# Patient Record
Sex: Male | Born: 1965
Health system: Southern US, Community
[De-identification: ages and names within clinical notes are randomized; demographics above are authoritative.]

## PROBLEM LIST (undated history)

## (undated) DIAGNOSIS — Q909 Down syndrome, unspecified: Secondary | ICD-10-CM

## (undated) DIAGNOSIS — H10409 Unspecified chronic conjunctivitis, unspecified eye: Secondary | ICD-10-CM

## (undated) DIAGNOSIS — Z8701 Personal history of pneumonia (recurrent): Secondary | ICD-10-CM

## (undated) DIAGNOSIS — B351 Tinea unguium: Secondary | ICD-10-CM

## (undated) HISTORY — PX: ENTROPIAN REPAIR: SHX1512

## (undated) HISTORY — DX: Down syndrome, unspecified: Q90.9

## (undated) HISTORY — DX: Personal history of pneumonia (recurrent): Z87.01

## (undated) HISTORY — DX: Tinea unguium: B35.1

## (undated) HISTORY — DX: Unspecified chronic conjunctivitis, unspecified eye: H10.409

---

## 2006-10-15 ENCOUNTER — Emergency Department: Payer: Self-pay | Admitting: Emergency Medicine

## 2011-01-05 ENCOUNTER — Inpatient Hospital Stay: Payer: Self-pay | Admitting: Internal Medicine

## 2012-05-15 ENCOUNTER — Ambulatory Visit: Payer: Medicare Other | Admitting: Internal Medicine

## 2012-08-29 ENCOUNTER — Ambulatory Visit (INDEPENDENT_AMBULATORY_CARE_PROVIDER_SITE_OTHER): Payer: Medicare Other | Admitting: Adult Health

## 2012-08-29 ENCOUNTER — Encounter: Payer: Self-pay | Admitting: Adult Health

## 2012-08-29 ENCOUNTER — Telehealth: Payer: Self-pay | Admitting: Internal Medicine

## 2012-08-29 ENCOUNTER — Ambulatory Visit (INDEPENDENT_AMBULATORY_CARE_PROVIDER_SITE_OTHER)
Admission: RE | Admit: 2012-08-29 | Discharge: 2012-08-29 | Disposition: A | Discharge status: Home / Self Care | Payer: Medicare Other | Source: Ambulatory Visit | Attending: Adult Health | Admitting: Adult Health

## 2012-08-29 VITALS — BP 106/68 | HR 73 | Temp 98.6°F | Resp 16 | Ht 62.0 in | Wt 204.0 lb

## 2012-08-29 DIAGNOSIS — R05 Cough: Secondary | ICD-10-CM

## 2012-08-29 DIAGNOSIS — H6123 Impacted cerumen, bilateral: Secondary | ICD-10-CM | POA: Insufficient documentation

## 2012-08-29 DIAGNOSIS — H6120 Impacted cerumen, unspecified ear: Secondary | ICD-10-CM

## 2012-08-29 MED ORDER — AZITHROMYCIN 250 MG PO TABS: ORAL_TABLET | ORAL | Status: DC

## 2012-08-29 MED ORDER — HYDROCODONE-HOMATROPINE 5-1.5 MG/5ML PO SYRP: 5.0000 mL | ORAL_SOLUTION | Freq: Three times a day (TID) | ORAL | Status: DC | PRN

## 2012-08-29 MED ORDER — FLUTICASONE PROPIONATE 50 MCG/ACT NA SUSP: NASAL | Status: DC

## 2012-08-29 MED ORDER — ALBUTEROL SULFATE (2.5 MG/3ML) 0.083% IN NEBU: 2.5000 mg | INHALATION_SOLUTION | Freq: Four times a day (QID) | RESPIRATORY_TRACT | Status: DC | PRN

## 2012-08-29 NOTE — Telephone Encounter (Signed)
Gwenyth Allegra is his sister and guardian. She is wanting to know the results of his x-ray

## 2012-08-29 NOTE — Patient Instructions (Addendum)
  Please have Chad Stewart start his antibiotic today. Azithromycin take 2 tablets on the first day and then take 1 tablet daily for the next 4 days.  Start flonase nasal spray. Two sprays in each nostril daily.  Hycodan for cough. This medication can cause drowsiness.  His lungs sound very congested. Please use the combivent inhaler regularly while he is having symptoms.  Please go to the [facility] office for chest xray.

## 2012-08-29 NOTE — Assessment & Plan Note (Addendum)
Lungs sound congested. Consolidation that does not clear with patient coughing. Suspect pneumonia. Start Azithromycin. Nebulizer with albuterol given in clinic. Flonase nasal spray. Combivent q6 hours as needed. Chest xray at [facility]. Note that greater than 45 minutes was spent with patient/caregiver in face to face communication during this visit.

## 2012-08-29 NOTE — Telephone Encounter (Signed)
Patient's guardian would like to know results of x-ray

## 2012-08-29 NOTE — Assessment & Plan Note (Signed)
Irrigate bilateral ears for disimpaction of cerumen.

## 2012-08-29 NOTE — Progress Notes (Signed)
  Subjective:    Patient ID: Chad Stewart, male    DOB: [birth year], 47 y.o.   MRN: [id number]  HPI  Patient is a 47 y/o male who presents to clinic for c/o cough, congestion, rhinorrhea. Uncertain if patient has had fever with these episodes. Patient is here with a caregiver from the [facility]. Patient has Down Syndrome and unable to provide information. The caregiver does not know much about the patient health history or allergies. The caregiver has brought in patient's entire chart for my review. Patient was previously followed at [facility] by Dr. Candelaria Stagers. Chad Stewart is here to establish care with our clinic.    Review of Systems  HENT: Positive for congestion, rhinorrhea and sinus pressure.   Respiratory: Positive for cough.   Cardiovascular: Negative.   Genitourinary: Negative.   Psychiatric/Behavioral: Negative for behavioral problems. The patient is not nervous/anxious.        Patient does not provide adequate ROS. Caregiver reports symptoms noted. Remainder of ROS negative.    BP 106/68  Pulse 73  Temp 98.6 F (37 C) (Oral)  Resp 16  Ht 5\' 2"  (1.575 m)  Wt 204 lb (92.534 kg)  BMI 37.31 kg/m2  SpO2 96%      Objective:   Physical Exam  Constitutional: He appears well-developed and well-nourished. No distress.       Patient is sitting on exam table. He has a calm demeanor. Cooperative.  HENT:  Head: Normocephalic and atraumatic.  Mouth/Throat: No oropharyngeal exudate.       Pharynx reddened. Nasal mucosa injected. Bilateral ears with cerumen impaction.  Eyes:       Left eye s/p entropion repair. Patient unable to fully open lids on the left eye.  Neck: Normal range of motion. Neck supple. No tracheal deviation present.  Cardiovascular: Normal rate, regular rhythm and normal heart sounds.  Exam reveals no gallop.   No murmur heard. Pulmonary/Chest: Effort normal. He has wheezes.       Rhonchi throughout upper lobes bilaterally, anterior/posterior. Does  not clear with cough. Patient c/o bilateral rib pain 2/2 coughing.  Abdominal: Soft. He exhibits no mass. There is no tenderness.       Bowel sounds hypoactive.  Musculoskeletal: He exhibits no edema and no tenderness.  Lymphadenopathy:    He has cervical adenopathy.  Neurological: He is alert.       Oriented to person.   Skin: Skin is warm and dry. No rash noted.  Psychiatric: He has a normal mood and affect. His behavior is normal.          Assessment & Plan:

## 2012-08-30 ENCOUNTER — Telehealth: Payer: Self-pay | Admitting: *Deleted

## 2012-08-30 NOTE — Telephone Encounter (Signed)
Manager of Group Home requesting copy of [id number] faxed

## 2012-10-05 ENCOUNTER — Ambulatory Visit (INDEPENDENT_AMBULATORY_CARE_PROVIDER_SITE_OTHER): Payer: Medicare Other | Admitting: Adult Health

## 2012-10-05 ENCOUNTER — Encounter: Payer: Self-pay | Admitting: Adult Health

## 2012-10-05 ENCOUNTER — Ambulatory Visit: Payer: Medicare Other | Admitting: Adult Health

## 2012-10-05 VITALS — BP 112/69 | HR 62 | Temp 99.8°F

## 2012-10-05 DIAGNOSIS — M109 Gout, unspecified: Secondary | ICD-10-CM

## 2012-10-05 MED ORDER — IBUPROFEN 600 MG PO TABS: ORAL_TABLET | ORAL | Status: DC

## 2012-10-05 MED ORDER — PREDNISONE (PAK) 10 MG PO TABS: ORAL_TABLET | ORAL | Status: DC

## 2012-10-05 NOTE — Progress Notes (Signed)
  Subjective:    Patient ID: SELIG WAMPOLE, male    DOB: 1966-02-27, 47 y.o.   MRN: [id number]  HPI  Elsworth presents to clinic with his care giver from the [facility]. He has pain in his left great toe and swelling. Symptoms began 2 days ago. He is having a hard time bearing weight on that foot.   Current Outpatient Prescriptions on File Prior to Visit  Medication Sig Dispense Refill  . albuterol-ipratropium (COMBIVENT) 18-103 MCG/ACT inhaler Inhale 2 puffs into the lungs every 6 (six) hours as needed. For wheezing      . Artificial Tear Ointment (REFRESH P.M. OP) Apply to eye 4 (four) times daily. Apply to left eye 4 times daily and to right eye at bedtime.      Marland Kitchen azithromycin (ZITHROMAX) 250 MG tablet Take 2 tablets on day 1 then take 1 tablet daily for the next 4 days.  6 tablet  0  . econazole nitrate 1 % cream Apply 1 application topically 2 (two) times daily. Apply a small amount to affected area twice daily      . fluticasone (FLONASE) 50 MCG/ACT nasal spray 2 sprays in each nostril daily  16 g  6  . HYDROcodone-homatropine (HYCODAN) 5-1.5 MG/5ML syrup Take 5 mLs by mouth every 8 (eight) hours as needed for cough.  120 mL  0  . prednisoLONE acetate (PRED FORTE) 1 % ophthalmic suspension Place 1 drop into the left eye daily.      Marland Kitchen tolnaftate (TINACTIN) 1 % spray Apply topically daily.       No current facility-administered medications on file prior to visit.     Review of Systems  Musculoskeletal: Positive for joint swelling.       Pain in left great toe  Skin:       Skin erythematous at left great toe    BP 112/69  Pulse 62  Temp(Src) 99.8 F (37.7 C) (Oral)  SpO2 96%     Objective:   Physical Exam  Constitutional: He appears well-developed and well-nourished.  Down Syndrome  Musculoskeletal: He exhibits edema and tenderness.  Tenderness upon palpation of movement of left great toe.  Skin: Skin is warm and dry. No rash noted. There is erythema.  Psychiatric:  He has a normal mood and affect. His behavior is normal. Judgment and thought content normal.           Assessment & Plan:

## 2012-10-05 NOTE — Patient Instructions (Addendum)
  I am treating Chad Stewart for gout.  Start ibuprofen 600 mg every 6 hours for 10 days. He needs to take this with food.  Start a prednisone taper. Start with 60 mg on the first day and decrease by 10 mg daily until done.  He will need to stay out of work on Friday. He can return to work on Monday.  If he is not improved within 4-5 days, please call the office.  Gout is triggered often by foods high in purines. He should limit the amount of red meats, seafood.

## 2012-10-05 NOTE — Assessment & Plan Note (Signed)
Start NSAIDS x 10 days. Prednisone taper. RTC if symptoms do not improve within 5 days.

## 2012-10-06 ENCOUNTER — Telehealth: Payer: Self-pay | Admitting: General Practice

## 2012-10-06 NOTE — Telephone Encounter (Signed)
Chad Stewart - I think this is your pt.

## 2012-10-06 NOTE — Telephone Encounter (Signed)
Pt sister called in regards to pt's gout. Would like to know what causes gout or are there any preventive measures, any special shoes? States the Group home does not keep her informed and she wants to make sure he is taken care of correctly.

## 2012-10-08 NOTE — Telephone Encounter (Signed)
Chad Stewart,  Can you send the following information to [name] sister?  Thanks, Chad Stewart  Gout is a type of arthritis. It occurs when uric acid builds up in blood and causes inflammation in the joints. Acute gout is a painful condition that often affects only one joint; Chronic gout is repeated episodes of pain and inflammation. More than one joint may be affected.  What causes gout? Gout occurs when excess uric acid (a normal waste product) collects in the body, and needle?like urate crystals deposit in the joints. This may happen because either uric acid production increases or, more often, the kidneys cannot remove uric acid from the body well enough. Certain foods and drugs may raise uric acid levels and lead to gout attacks. These include the following: Foods such as shellfish and red meats  Alcohol in excess  Sugary drinks and foods that are high in fructose  Some medications  low-dose aspirin (but because it can help protect against heart attacks and strokes, we do not recommend that people with gout stop taking low-dose aspirin)  certain diuretics ("water pills") such as hydrochlorothiazide (Esidrix, Hydro?D)  immunosuppressants used in organ transplants such as cyclosporine (Neoral, Sandimmune) and tacrolimus (Prograf) Over time, increased uric acid levels in the blood may lead to deposits of urate crystals in and around the joints. These crystals can attract white blood cells, leading to severe, painful gout attacks and chronic arthritis. Uric acid also can deposit in the urinary tract, causing kidney stones.   Patient Instructions Foods that have a high purine content (i.e., alcohol, seafood, and offal) are associated with higher risk of elevated uric acid and gout. [2]  Reducing intake of alcohol, especially beer, lowers the risk of gout. [29]  Reducing seafood and meat intake helps to a lesser degree. Reducing the intake of vegetables high in purines (i.e., asparagus, spinach,  and mushrooms) does not seem to affect uric acid levels.  Dairy products reduce the risk of gout.

## 2012-10-09 ENCOUNTER — Telehealth: Payer: Self-pay | Admitting: Internal Medicine

## 2012-10-09 ENCOUNTER — Encounter: Payer: Self-pay | Admitting: *Deleted

## 2012-10-09 NOTE — Telephone Encounter (Signed)
Spoke with patient sister, she will come pick this information. Information up front for her to pick up

## 2012-10-09 NOTE — Telephone Encounter (Signed)
KAY MORRIS PT SISTER WANTED TO KNOW IF Chad Stewart NEEDS TO SEE UROLOGIEST ABOUT HIS GOUT.   DOES PT NEED TO HAVE SPECIAL DIET TO HELP CONTROL THIS  PLEASE ADVISE

## 2012-10-10 NOTE — Telephone Encounter (Signed)
Nothing further. At this time he does not need to see a urologist.  Thanks, Dianah Pruett

## 2012-10-10 NOTE — Telephone Encounter (Signed)
Patient sister was given a handout of information on gout, anything further you would like for me tell his sister?

## 2012-10-11 NOTE — Telephone Encounter (Signed)
Informed patient sister of this information and she verbally agreed.

## 2012-10-13 ENCOUNTER — Telehealth: Payer: Self-pay | Admitting: Internal Medicine

## 2012-10-13 NOTE — Telephone Encounter (Signed)
LEFT MESSAGE FOR JESSICA TO CALL OFFICE CHECKING TO SEE IF RAQUEL REY CAN BE MS Deandrade FOR PRIMARY CARE OR DO THAT WANT DR Dan Humphreys IF THEY WANT DR WALKER MR Lovick NEEDS APPOINTMENT WITH DR Dan Humphreys TO FILL OUT FORMS

## 2012-10-19 ENCOUNTER — Telehealth: Payer: Self-pay | Admitting: *Deleted

## 2012-10-19 NOTE — Telephone Encounter (Signed)
Faxed documentation back to [facility] per Raquel's instructions

## 2012-12-26 ENCOUNTER — Telehealth: Payer: Self-pay | Admitting: Adult Health

## 2012-12-26 NOTE — Telephone Encounter (Signed)
If there is a opening, then we can use it. Otherwise, we will need to use next available spot for physical exam

## 2012-12-26 NOTE — Telephone Encounter (Signed)
Fwd to Dr. Walker 

## 2012-12-26 NOTE — Telephone Encounter (Signed)
Spoke with Zella Ball, appointment was confirmed for June 17 at 9:00

## 2012-12-26 NOTE — Telephone Encounter (Signed)
Chad Stewart @ [facility] She needs to get Mr Summer in for a cpx  Between  Dates 01/05/13 @ 02/05/13 for medicare to pay.  Pt was schedule with Raquel 01/15/13 but Zella Ball stated medicare would not pay unless paperwork was a md sign them.  She wanted to know if he could be worked in earlier with you

## 2013-01-15 ENCOUNTER — Encounter: Payer: Medicare Other | Admitting: Adult Health

## 2013-01-25 ENCOUNTER — Telehealth: Payer: Self-pay | Admitting: Adult Health

## 2013-01-26 ENCOUNTER — Encounter: Payer: Self-pay | Admitting: Adult Health

## 2013-01-26 ENCOUNTER — Ambulatory Visit (INDEPENDENT_AMBULATORY_CARE_PROVIDER_SITE_OTHER): Payer: Medicare Other | Admitting: Adult Health

## 2013-01-26 VITALS — BP 106/70 | HR 64 | Temp 98.3°F | Resp 12 | Ht 61.5 in | Wt 207.0 lb

## 2013-01-26 DIAGNOSIS — Z0000 Encounter for general adult medical examination without abnormal findings: Secondary | ICD-10-CM

## 2013-01-26 MED ORDER — IPRATROPIUM-ALBUTEROL 18-103 MCG/ACT IN AERO: 2.0000 | INHALATION_SPRAY | Freq: Four times a day (QID) | RESPIRATORY_TRACT | Status: AC | PRN

## 2013-01-26 MED ORDER — FLUTICASONE PROPIONATE 50 MCG/ACT NA SUSP: NASAL | Status: DC

## 2013-01-26 MED ORDER — PREDNISOLONE ACETATE 1 % OP SUSP: 1.0000 [drp] | Freq: Every day | OPHTHALMIC | Status: AC

## 2013-01-26 MED ORDER — TOLNAFTATE 1 % EX AERP: INHALATION_SPRAY | Freq: Every day | CUTANEOUS | Status: DC

## 2013-01-26 MED ORDER — ACETAMINOPHEN 325 MG PO TABS: 650.0000 mg | ORAL_TABLET | ORAL | Status: AC | PRN

## 2013-01-26 MED ORDER — ECONAZOLE NITRATE 1 % EX CREA: 1.0000 "application " | TOPICAL_CREAM | Freq: Two times a day (BID) | CUTANEOUS | Status: DC

## 2013-01-26 NOTE — Progress Notes (Signed)
  Subjective:    Patient ID: Chad Stewart, male    DOB: 03/20/1966, 47 y.o.   MRN: [id number]  HPI  Patient is a 47 year old male with history of Down syndrome who presents to clinic for physical exam and completion of an FL2 form. Patient is in his usual joking behavior. He presents to clinic with one of his caregivers. Patient lives at [facility].   Past Medical History  Diagnosis Date  . Down syndrome   . Chronic conjunctivitis   . History of pneumonia   . Onychomycosis     Past Surgical History  Procedure Laterality Date  . Entropian repair      History   Social History  . Marital Status: Single    Spouse Name: N/A    Number of Children: N/A  . Years of Education: N/A   Occupational History  . Not on file.   Social History Main Topics  . Smoking status: Never Smoker   . Smokeless tobacco: Never Used  . Alcohol Use: No  . Drug Use: No  . Sexually Active: Not on file   Other Topics Concern  . Not on file   Social History Narrative  . No narrative on file     Review of Systems  Unable to perform ROS Secondary to cognitive deficit. Pt reports "feeling fine"     Objective:   Physical Exam  Constitutional:  Overweight, pleasant male in no apparent distress  HENT:  Head: Normocephalic and atraumatic.  Nose: Nose normal.  Mouth/Throat: Oropharynx is clear and moist.  Bilateral ears are impacted with cerumen  Eyes: EOM are normal.  Left eye s/p entropion repair with scar tissue; chronic conjunctivitis  Neck: Normal range of motion. Neck supple. No tracheal deviation present. No thyromegaly present.  Cardiovascular: Normal rate, regular rhythm, normal heart sounds and intact distal pulses.  Exam reveals no gallop and no friction rub.   No murmur heard. Pulmonary/Chest: Effort normal and breath sounds normal. No respiratory distress. He has no wheezes. He has no rales.  Abdominal: Soft. Bowel sounds are normal.  Musculoskeletal: Normal range of  motion. He exhibits no edema and no tenderness.  Lymphadenopathy:    He has no cervical adenopathy.  Neurological: He is alert. Coordination normal.  Skin: Skin is warm and dry.  Onychomycosis of all toes  Psychiatric:  Patient is very feely, touchy. Inappropriately hugging provider and holding on while trying to perform exam.          Assessment & Plan:

## 2013-01-26 NOTE — Patient Instructions (Addendum)
  Please return on Monday morning for fasting labs. You may have water or black coffee.   We will notify you once the results are available.  Your FL2 will be ready on Monday.

## 2013-01-29 ENCOUNTER — Other Ambulatory Visit (INDEPENDENT_AMBULATORY_CARE_PROVIDER_SITE_OTHER): Payer: Medicare Other

## 2013-01-29 ENCOUNTER — Other Ambulatory Visit: Payer: Self-pay | Admitting: *Deleted

## 2013-01-29 ENCOUNTER — Telehealth: Payer: Self-pay | Admitting: *Deleted

## 2013-01-29 DIAGNOSIS — Z0000 Encounter for general adult medical examination without abnormal findings: Secondary | ICD-10-CM

## 2013-01-29 LAB — COMPREHENSIVE METABOLIC PANEL
AST: 18 U/L (ref 0–37)
Albumin: 3.5 g/dL (ref 3.5–5.2)
Alkaline Phosphatase: 51 U/L (ref 39–117)
BUN: 14 mg/dL (ref 6–23)
Calcium: 8.5 mg/dL (ref 8.4–10.5)
Chloride: 106 mEq/L (ref 96–112)
Potassium: 4.2 mEq/L (ref 3.5–5.1)
Sodium: 140 mEq/L (ref 135–145)
Total Protein: 7 g/dL (ref 6.0–8.3)

## 2013-01-29 LAB — CBC WITH DIFFERENTIAL/PLATELET
Basophils Relative: 0.7 % (ref 0.0–3.0)
Eosinophils Absolute: 0 10*3/uL (ref 0.0–0.7)
Eosinophils Relative: 0.5 % (ref 0.0–5.0)
Lymphocytes Relative: 20 % (ref 12.0–46.0)
MCHC: 33.7 g/dL (ref 30.0–36.0)
MCV: 97.6 fl (ref 78.0–100.0)
Monocytes Absolute: 0.4 10*3/uL (ref 0.1–1.0)
Neutrophils Relative %: 72 % (ref 43.0–77.0)
Platelets: 181 10*3/uL (ref 150.0–400.0)
RBC: 5.26 Mil/uL (ref 4.22–5.81)
WBC: 6.3 10*3/uL (ref 4.5–10.5)

## 2013-01-29 LAB — LIPID PANEL
LDL Cholesterol: 107 mg/dL — ABNORMAL HIGH (ref 0–99)
Total CHOL/HDL Ratio: 4

## 2013-01-29 MED ORDER — GUAIFENESIN ER 600 MG PO TB12: 1200.0000 mg | ORAL_TABLET | Freq: Two times a day (BID) | ORAL | Status: AC | PRN

## 2013-01-29 NOTE — Telephone Encounter (Signed)
Needs Rx for Mucinex [facility]n [facility]

## 2013-01-30 ENCOUNTER — Encounter: Payer: Self-pay | Admitting: *Deleted

## 2013-01-30 ENCOUNTER — Encounter: Payer: Medicare Other | Admitting: Internal Medicine

## 2013-04-24 ENCOUNTER — Telehealth: Payer: Self-pay | Admitting: Adult Health

## 2013-04-24 ENCOUNTER — Ambulatory Visit (INDEPENDENT_AMBULATORY_CARE_PROVIDER_SITE_OTHER): Payer: Medicare Other | Admitting: Adult Health

## 2013-04-24 ENCOUNTER — Other Ambulatory Visit: Payer: Self-pay | Admitting: Adult Health

## 2013-04-24 ENCOUNTER — Ambulatory Visit (INDEPENDENT_AMBULATORY_CARE_PROVIDER_SITE_OTHER)
Admission: RE | Admit: 2013-04-24 | Discharge: 2013-04-24 | Disposition: A | Discharge status: Home / Self Care | Payer: Medicare Other | Source: Ambulatory Visit | Attending: Adult Health | Admitting: Adult Health

## 2013-04-24 ENCOUNTER — Encounter: Payer: Self-pay | Admitting: Adult Health

## 2013-04-24 VITALS — BP 106/62 | HR 54 | Temp 98.4°F | Resp 12 | Ht 61.5 in | Wt 213.0 lb

## 2013-04-24 DIAGNOSIS — M25571 Pain in right ankle and joints of right foot: Secondary | ICD-10-CM

## 2013-04-24 DIAGNOSIS — M25579 Pain in unspecified ankle and joints of unspecified foot: Secondary | ICD-10-CM

## 2013-04-24 MED ORDER — PREDNISONE (PAK) 10 MG PO TABS: ORAL_TABLET | ORAL | Status: DC

## 2013-04-24 MED ORDER — POLYMYXIN B-TRIMETHOPRIM 10000-0.1 UNIT/ML-% OP SOLN: 1.0000 [drp] | OPHTHALMIC | Status: DC

## 2013-04-24 NOTE — Telephone Encounter (Signed)
See other phone message  

## 2013-04-24 NOTE — Telephone Encounter (Signed)
Sent in polytrim eye drops

## 2013-04-24 NOTE — Telephone Encounter (Signed)
Pt's sister notified that eye drops sent to pharmacy.

## 2013-04-24 NOTE — Telephone Encounter (Signed)
The patient's sister is wanting to know if the NP was going to call the patient in some eye drops. She thinks he has pink eye.

## 2013-04-24 NOTE — Patient Instructions (Addendum)
  Xray right foot at [facility] prednisone taper - 60 mg on day #1 then taper by 10 mg daily until complete

## 2013-04-24 NOTE — Telephone Encounter (Signed)
Thinks they forgot pt eye drops at appt this morning.  Please advise if this needs to be picked up.

## 2013-04-24 NOTE — Assessment & Plan Note (Signed)
Send for xray to r/o any bony abnormality. Will start prednisone taper since hx of gout. RTC if symptoms do not improve within 3-4 days.

## 2013-04-24 NOTE — Progress Notes (Signed)
  Subjective:    Patient ID: Chad Stewart, male    DOB: [birth year], 47 y.o.   MRN: [id number]  HPI  Patient presents with c/o of right foot pain. He denies having any injury to the area. He has a hx of gout. Pt is getting ready to go on a trip to [place]. He presents to clinic with his sister. Sister reports that he was limping on Saturday but has not noticed any limping today.  Current Outpatient Prescriptions on File Prior to Visit  Medication Sig Dispense Refill  . acetaminophen (TYLENOL) 325 MG tablet Take 2 tablets (650 mg total) by mouth every 4 (four) hours as needed for pain or fever.  90 tablet  6  . albuterol-ipratropium (COMBIVENT) 18-103 MCG/ACT inhaler Inhale 2 puffs into the lungs every 6 (six) hours as needed. For wheezing  14.7 g  6  . Artificial Tear Ointment (REFRESH P.M. OP) Apply to eye 4 (four) times daily. Apply to left eye 4 times daily and to right eye at bedtime.      Marland Kitchen econazole nitrate 1 % cream Apply 1 application topically 2 (two) times daily. Apply a small amount to affected area twice daily  15 g  6  . fluticasone (FLONASE) 50 MCG/ACT nasal spray 2 sprays in each nostril daily  16 g  6  . guaiFENesin (MUCINEX) 600 MG 12 hr tablet Take 2 tablets (1,200 mg total) by mouth 2 (two) times daily as needed for congestion.  90 tablet  2  . HYDROcodone-homatropine (HYCODAN) 5-1.5 MG/5ML syrup Take 5 mLs by mouth every 8 (eight) hours as needed for cough.  120 mL  0  . prednisoLONE acetate (PRED FORTE) 1 % ophthalmic suspension Place 1 drop into the left eye daily.  5 mL  6  . tolnaftate (TINACTIN) 1 % spray Apply topically daily.  130 g  6   No current facility-administered medications on file prior to visit.    Review of Systems  Musculoskeletal:       Pain right foot on the outer aspect     BP 106/62  Pulse 54  Temp(Src) 98.4 F (36.9 C) (Oral)  Resp 12  Ht 5' 1.5" (1.562 m)  Wt 213 lb (96.616 kg)  BMI 39.6 kg/m2  SpO2 95%     Objective:   Physical  Exam  Constitutional: He is oriented to person, place, and time.  47 y/o Down Syndrome patient in NAD  Musculoskeletal:  Right foot pain. There is some edema and erythema  Neurological: He is alert and oriented to person, place, and time.  Psychiatric: He has a normal mood and affect. His behavior is normal.          Assessment & Plan:

## 2013-04-30 ENCOUNTER — Telehealth: Payer: Self-pay | Admitting: Adult Health

## 2013-04-30 NOTE — Telephone Encounter (Signed)
[facility] wanting to know when he should stop the eye drops he was prescribed. Please call [phone]

## 2013-05-01 ENCOUNTER — Telehealth: Payer: Self-pay | Admitting: Adult Health

## 2013-05-01 NOTE — Telephone Encounter (Signed)
States they need discontinuation order for the eye drops stating to discontinue today.  Fax to [phone]

## 2013-05-01 NOTE — Telephone Encounter (Signed)
Group home called back regarding length of time for eyedrops. Advised 7 days.

## 2013-05-01 NOTE — Telephone Encounter (Signed)
7 days

## 2013-05-01 NOTE — Telephone Encounter (Signed)
Order faxed.

## 2013-05-02 ENCOUNTER — Other Ambulatory Visit: Payer: Self-pay | Admitting: *Deleted

## 2013-05-02 MED ORDER — HYDROCODONE-HOMATROPINE 5-1.5 MG/5ML PO SYRP: 5.0000 mL | ORAL_SOLUTION | Freq: Three times a day (TID) | ORAL | Status: DC | PRN

## 2013-05-02 NOTE — Telephone Encounter (Signed)
OK to refill

## 2013-05-02 NOTE — Telephone Encounter (Signed)
Rx faxed

## 2013-05-02 NOTE — Telephone Encounter (Signed)
He has a cough and would like a refill of the Hycodan cough syrup. The last prescription has expired and they usually give this to him PRN and they are trying to catch it before it develops into something worse. Last prescription was sent in by Raquel.

## 2013-05-09 ENCOUNTER — Ambulatory Visit (INDEPENDENT_AMBULATORY_CARE_PROVIDER_SITE_OTHER): Payer: Medicare Other | Admitting: Adult Health

## 2013-05-09 ENCOUNTER — Encounter: Payer: Self-pay | Admitting: *Deleted

## 2013-05-09 ENCOUNTER — Encounter: Payer: Self-pay | Admitting: Adult Health

## 2013-05-09 VITALS — BP 110/62 | HR 60 | Temp 98.4°F | Resp 12 | Wt 200.5 lb

## 2013-05-09 DIAGNOSIS — R05 Cough: Secondary | ICD-10-CM

## 2013-05-09 DIAGNOSIS — L0292 Furuncle, unspecified: Secondary | ICD-10-CM

## 2013-05-09 DIAGNOSIS — H109 Unspecified conjunctivitis: Secondary | ICD-10-CM

## 2013-05-09 MED ORDER — HYDROCODONE-HOMATROPINE 5-1.5 MG/5ML PO SYRP: 5.0000 mL | ORAL_SOLUTION | Freq: Three times a day (TID) | ORAL | Status: DC | PRN

## 2013-05-09 MED ORDER — DOXYCYCLINE HYCLATE 100 MG PO TABS: ORAL_TABLET | ORAL | Status: DC

## 2013-05-09 MED ORDER — POLYMYXIN B-TRIMETHOPRIM 10000-0.1 UNIT/ML-% OP SOLN: 1.0000 [drp] | OPHTHALMIC | Status: DC

## 2013-05-09 NOTE — Assessment & Plan Note (Addendum)
Start doxycycline 100 mg bid x 14 days. Neosporin to area twice daily x 7 days. RTC if no improvement or if symptoms worsen. Note, greater than 30 min spent in face to face communication with patient/caregiver in review of current symptoms and in the assessment planning and implementation of care pertaining to the 3 problems listed today. Also, since patient lives in a group home, form was filled out with the instructions on his care. They do not accept the AVS as the instructions.

## 2013-05-09 NOTE — Progress Notes (Signed)
  Subjective:    Patient ID: Chad Stewart, male    DOB: Sep 28, 1965, 47 y.o.   MRN: [id number]  HPI  Patient is a pleasant 47 year old male with Down syndrome who presents to clinic with his caregiver. Caregiver reports that patient has been coughing. No sick contacts although he lives in a group home. Denies fever or chills. Also having redness in the right eye concerning for conjunctivitis. He was recently seen a few weeks ago with conjunctivitis in the left eye status post treatment. Also concerns of possible boil above penis. Foul odor reported by patient from this site.   Current Outpatient Prescriptions on File Prior to Visit  Medication Sig Dispense Refill  . acetaminophen (TYLENOL) 325 MG tablet Take 2 tablets (650 mg total) by mouth every 4 (four) hours as needed for pain or fever.  90 tablet  6  . albuterol-ipratropium (COMBIVENT) 18-103 MCG/ACT inhaler Inhale 2 puffs into the lungs every 6 (six) hours as needed. For wheezing  14.7 g  6  . Artificial Tear Ointment (REFRESH P.M. OP) Apply to eye 4 (four) times daily. Apply to left eye 4 times daily and to right eye at bedtime.      Marland Kitchen econazole nitrate 1 % cream Apply 1 application topically 2 (two) times daily. Apply a small amount to affected area twice daily  15 g  6  . fluticasone (FLONASE) 50 MCG/ACT nasal spray 2 sprays in each nostril daily  16 g  6  . guaiFENesin (MUCINEX) 600 MG 12 hr tablet Take 2 tablets (1,200 mg total) by mouth 2 (two) times daily as needed for congestion.  90 tablet  2  . prednisoLONE acetate (PRED FORTE) 1 % ophthalmic suspension Place 1 drop into the left eye daily.  5 mL  6  . tolnaftate (TINACTIN) 1 % spray Apply topically daily.  130 g  6   No current facility-administered medications on file prior to visit.    Review of Systems  HENT: Positive for postnasal drip. Negative for congestion and sinus pressure.   Respiratory: Positive for cough. Negative for shortness of breath and wheezing.    Genitourinary:       Skin bump in area above penis with foul odor       Objective:   Physical Exam  Constitutional:  No apparent distress  HENT:  Head: Normocephalic and atraumatic.  Eyes:  Right eye conjunctivitis  Cardiovascular: Normal rate and regular rhythm.   Pulmonary/Chest: Effort normal.  Unable to assess breath sounds - patient would not take deep breath.  Genitourinary:  Skin boil in area above penis. Appears to have scab. Uncertain if this is from patient manipulation of area. No drainage.  Lymphadenopathy:    He has no cervical adenopathy.  Neurological: He is alert.  Psychiatric: He has a normal mood and affect.    BP 110/62  Pulse 60  Temp(Src) 98.4 F (36.9 C) (Oral)  Resp 12  Wt 200 lb 8 oz (90.946 kg)  BMI 37.28 kg/m2  SpO2 96%     Assessment & Plan:

## 2013-05-09 NOTE — Assessment & Plan Note (Signed)
Seen several weeks ago for conjunctivitis of left eye. Now right eye. Polytrim every 4 hours to OU. RTC if no improvement.

## 2013-05-09 NOTE — Assessment & Plan Note (Addendum)
Start doxycycline. Continue hycodan syrup. RTC if no improvement within 3-4 days.

## 2013-05-10 ENCOUNTER — Telehealth: Payer: Self-pay | Admitting: *Deleted

## 2013-05-10 NOTE — Telephone Encounter (Signed)
Spoke with sister about appointment and treatment. Verbalized understanding.

## 2013-05-10 NOTE — Telephone Encounter (Signed)
Patient sister Joyce Gross left a voicemail yesterday stating she would like someone to call her about his appointment yesterday. He was seen by Raquel yesterday. She is his guardian and would like to know what was done at his appointment, he is going out of town and want to make sure he is ok.

## 2013-05-12 ENCOUNTER — Emergency Department: Payer: Self-pay | Admitting: Emergency Medicine

## 2013-05-12 LAB — COMPREHENSIVE METABOLIC PANEL
Albumin: 3 g/dL — ABNORMAL LOW (ref 3.4–5.0)
Alkaline Phosphatase: 73 U/L (ref 50–136)
Anion Gap: 1 — ABNORMAL LOW (ref 7–16)
BUN: 12 mg/dL (ref 7–18)
Bilirubin,Total: 0.4 mg/dL (ref 0.2–1.0)
Calcium, Total: 8.9 mg/dL (ref 8.5–10.1)
Chloride: 104 mmol/L (ref 98–107)
Co2: 34 mmol/L — ABNORMAL HIGH (ref 21–32)
Creatinine: 1.28 mg/dL (ref 0.60–1.30)
EGFR (African American): >60
EGFR (Non-African Amer.): >60
Glucose: 98 mg/dL (ref 65–99)
Osmolality: 277 (ref 275–301)
Potassium: 4.3 mmol/L (ref 3.5–5.1)
SGPT (ALT): 25 U/L (ref 12–78)
Total Protein: 7.1 g/dL (ref 6.4–8.2)

## 2013-05-12 LAB — CBC
HCT: 46.5 % (ref 40.0–52.0)
HGB: 16 g/dL (ref 13.0–18.0)
MCH: 33.1 pg (ref 26.0–34.0)
RDW: 14.1 % (ref 11.5–14.5)
WBC: 8 10*3/uL (ref 3.8–10.6)

## 2013-05-12 LAB — TROPONIN I: Troponin-I: <0.02 ng/mL

## 2013-05-28 ENCOUNTER — Telehealth: Payer: Self-pay | Admitting: Adult Health

## 2013-05-28 NOTE — Telephone Encounter (Signed)
Needs referral for pt to see Dr. Gertie Baron for consultation asap.  Dr. Chestine Spore has agreed to see pt but requires the referral.

## 2013-05-29 NOTE — Telephone Encounter (Signed)
Patients sister called back and wanted someone to clarify that he had never seen Dr. Dan Humphreys. I explained to her that there was an apt made last year but it was cancelled, when calling back I'm not sure of the conversation that took place but it could have been Dr. Dan Humphreys wasn't accepting new patients, but I couldn't be sure. The patient had an apt made with Raquel as a new patient, and there were no notes made as to who called and made it for him.

## 2013-05-29 NOTE — Telephone Encounter (Signed)
Pt's sister would like pt to have referral to Dr. Chestine Spore for repair of his eye lid. States he had a surgery years ago that has left a hole in his eye. Not an urgent appointment, as he has dealt with this for years. He uses drops 2-3 times daily to keep is moist. States she is ok until Raquel returns to the office on Monday. She was also upset that she thought Dr. Dan Humphreys was his PCP, but I only see where he has seen Raquel. States when she came by office last year, she was told Dr. Dan Humphreys would be his PCP because she sees sister and another relative. Can this be changed?

## 2013-05-29 NOTE — Telephone Encounter (Signed)
We generally don't change providers within the office, but I can discuss with Raquel when she returns. He will need to be seen for referral.

## 2013-06-04 NOTE — Telephone Encounter (Signed)
Spoke with Joyce Gross (sister). She reports that original appt was set with Dr. Dan Humphreys. Does not know who changed the appt. She is ok with pt staying with me.

## 2013-06-14 ENCOUNTER — Ambulatory Visit (INDEPENDENT_AMBULATORY_CARE_PROVIDER_SITE_OTHER): Payer: Medicare Other

## 2013-06-14 DIAGNOSIS — Z23 Encounter for immunization: Secondary | ICD-10-CM

## 2013-07-09 ENCOUNTER — Ambulatory Visit (INDEPENDENT_AMBULATORY_CARE_PROVIDER_SITE_OTHER): Payer: Medicare Other | Admitting: Adult Health

## 2013-07-09 VITALS — BP 124/74 | HR 68 | Temp 98.4°F | Wt 218.0 lb

## 2013-07-09 DIAGNOSIS — M109 Gout, unspecified: Secondary | ICD-10-CM

## 2013-07-09 MED ORDER — OMEPRAZOLE 20 MG PO CPDR: 20.0000 mg | DELAYED_RELEASE_CAPSULE | Freq: Every day | ORAL | Status: DC

## 2013-07-09 MED ORDER — NAPROXEN 500 MG PO TABS: 500.0000 mg | ORAL_TABLET | Freq: Two times a day (BID) | ORAL | Status: DC

## 2013-07-09 NOTE — Progress Notes (Signed)
  Subjective:    Patient ID: Chad Stewart, male    DOB: 18-Aug-1965, 47 y.o.   MRN: [id number]  HPI  Patient is a pleasant Down syndrome 47 year old male who presents to clinic with right great toe pain. Pt has hx of gout. No fever, chills. Denies injury.  Current Outpatient Prescriptions on File Prior to Visit  Medication Sig Dispense Refill  . acetaminophen (TYLENOL) 325 MG tablet Take 2 tablets (650 mg total) by mouth every 4 (four) hours as needed for pain or fever.  90 tablet  6  . albuterol-ipratropium (COMBIVENT) 18-103 MCG/ACT inhaler Inhale 2 puffs into the lungs every 6 (six) hours as needed. For wheezing  14.7 g  6  . Artificial Tear Ointment (REFRESH P.M. OP) Apply to eye 4 (four) times daily. Apply to left eye 4 times daily and to right eye at bedtime.      Marland Kitchen doxycycline (VIBRA-TABS) 100 MG tablet Take 1 tablet twice daily for 14 days.  28 tablet  0  . econazole nitrate 1 % cream Apply 1 application topically 2 (two) times daily. Apply a small amount to affected area twice daily  15 g  6  . fluticasone (FLONASE) 50 MCG/ACT nasal spray 2 sprays in each nostril daily  16 g  6  . guaiFENesin (MUCINEX) 600 MG 12 hr tablet Take 2 tablets (1,200 mg total) by mouth 2 (two) times daily as needed for congestion.  90 tablet  2  . prednisoLONE acetate (PRED FORTE) 1 % ophthalmic suspension Place 1 drop into the left eye daily.  5 mL  6  . tolnaftate (TINACTIN) 1 % spray Apply topically daily.  130 g  6  . trimethoprim-polymyxin b (POLYTRIM) ophthalmic solution Place 1 drop into both eyes every 4 (four) hours.  10 mL  0   No current facility-administered medications on file prior to visit.     Review of Systems  Musculoskeletal:       Pain right great toe.  Skin:       Redness right great toe and surrounding area. No broken skin or puncture wounds noted.       Objective:   Physical Exam        Assessment & Plan:

## 2013-07-09 NOTE — Assessment & Plan Note (Signed)
Right great toe and surrounding area. Start Naproxen 500 mg bid x 10 days. Omeprazole 20 mg daily while taking naproxen. RTC if no improvement within 3 days.

## 2013-10-01 ENCOUNTER — Ambulatory Visit (INDEPENDENT_AMBULATORY_CARE_PROVIDER_SITE_OTHER): Payer: Medicare Other | Admitting: Adult Health

## 2013-10-01 ENCOUNTER — Encounter (INDEPENDENT_AMBULATORY_CARE_PROVIDER_SITE_OTHER): Payer: Self-pay

## 2013-10-01 ENCOUNTER — Encounter: Payer: Self-pay | Admitting: Adult Health

## 2013-10-01 VITALS — BP 97/65 | HR 64 | Temp 98.2°F | Resp 14 | Wt 219.0 lb

## 2013-10-01 DIAGNOSIS — M79675 Pain in left toe(s): Secondary | ICD-10-CM

## 2013-10-01 DIAGNOSIS — M79609 Pain in unspecified limb: Secondary | ICD-10-CM

## 2013-10-01 MED ORDER — ALLOPURINOL 100 MG PO TABS: ORAL_TABLET | ORAL | Status: DC

## 2013-10-01 MED ORDER — OMEPRAZOLE 20 MG PO CPDR: 20.0000 mg | DELAYED_RELEASE_CAPSULE | Freq: Every day | ORAL | Status: DC

## 2013-10-01 MED ORDER — NAPROXEN 500 MG PO TABS: 500.0000 mg | ORAL_TABLET | Freq: Two times a day (BID) | ORAL | Status: DC

## 2013-10-01 NOTE — Progress Notes (Signed)
   Subjective:    Patient ID: Chad Stewart, male    DOB: 1966/05/10, 48 y.o.   MRN: [id number]  HPI Pt is a pleasant 48 y/o male who presents to clinic with her caregiver. Pt has a hx of gout and presents with left great toe pain, swelling and mild erythema. No other symptoms reports.   Current Outpatient Prescriptions on File Prior to Visit  Medication Sig Dispense Refill  . acetaminophen (TYLENOL) 325 MG tablet Take 2 tablets (650 mg total) by mouth every 4 (four) hours as needed for pain or fever.  90 tablet  6  . albuterol-ipratropium (COMBIVENT) 18-103 MCG/ACT inhaler Inhale 2 puffs into the lungs every 6 (six) hours as needed. For wheezing  14.7 g  6  . Artificial Tear Ointment (REFRESH P.M. OP) Apply to eye 4 (four) times daily. Apply to left eye 4 times daily and to right eye at bedtime.      Marland Kitchen. econazole nitrate 1 % cream Apply 1 application topically 2 (two) times daily. Apply a small amount to affected area twice daily  15 g  6  . fluticasone (FLONASE) 50 MCG/ACT nasal spray 2 sprays in each nostril daily  16 g  6  . guaiFENesin (MUCINEX) 600 MG 12 hr tablet Take 2 tablets (1,200 mg total) by mouth 2 (two) times daily as needed for congestion.  90 tablet  2  . prednisoLONE acetate (PRED FORTE) 1 % ophthalmic suspension Place 1 drop into the left eye daily.  5 mL  6  . tolnaftate (TINACTIN) 1 % spray Apply topically daily.  130 g  6  . trimethoprim-polymyxin b (POLYTRIM) ophthalmic solution Place 1 drop into both eyes every 4 (four) hours.  10 mL  0   No current facility-administered medications on file prior to visit.      Review of Systems  Musculoskeletal: Positive for joint swelling (left great toe pain).  Skin: Negative.   All other systems reviewed and are negative.       Objective:   Physical Exam  Cardiovascular: Normal rate and regular rhythm.   Pulmonary/Chest: Effort normal. No respiratory distress.  Musculoskeletal: He exhibits edema and tenderness.  Able  to wiggle his toes on the left foot. Swelling and erythema of left great toe.  Neurological: He is alert.  Skin: There is erythema.  Psychiatric: He has a normal mood and affect. His behavior is normal. Judgment and thought content normal.       Assessment & Plan:    1. Pain of left great toe Suspect gout. Start Naproxen bid x 10 days. Prilosec while taking naproxen. Start Allopurinol after completing naproxen. RTC prn.

## 2013-10-01 NOTE — Progress Notes (Signed)
Pre visit review using our clinic review tool, if applicable. No additional management support is needed unless otherwise documented below in the visit note. 

## 2013-11-01 ENCOUNTER — Other Ambulatory Visit: Payer: Self-pay | Admitting: *Deleted

## 2013-11-01 NOTE — Telephone Encounter (Signed)
Note from pharmacy: "Please respond with refills or D/C med".  Previous refill encounter mentions the facility keeps this on hand prn. Please advise.

## 2013-11-07 ENCOUNTER — Telehealth: Payer: Self-pay | Admitting: *Deleted

## 2013-11-07 NOTE — Telephone Encounter (Signed)
Please call pt's caregiver and ask how Chad Stewart is doing. They are requesting hydromet - is he sick?

## 2013-11-07 NOTE — Telephone Encounter (Signed)
Refill request  Hydromet syrup  120 mL  Take one teaspoonful (5mL) by mouth every 8 hours as needed for couch

## 2013-11-09 NOTE — Telephone Encounter (Signed)
Left message to return my call.  

## 2013-11-13 NOTE — Telephone Encounter (Signed)
Spoke with caregiver, Cyndia BentFrancine, she stated that pt has had a cough for about 2 weeks, its getting better.  Pt was taking the Hydromet but ran out and would like a refill.

## 2013-11-14 ENCOUNTER — Other Ambulatory Visit: Payer: Self-pay | Admitting: *Deleted

## 2013-11-14 MED ORDER — HYDROCODONE-HOMATROPINE 5-1.5 MG/5ML PO SYRP: 5.0000 mL | ORAL_SOLUTION | Freq: Three times a day (TID) | ORAL | Status: AC | PRN

## 2013-11-14 NOTE — Telephone Encounter (Signed)
Notified pt caregiver that Rx was ready for pick up

## 2013-11-14 NOTE — Telephone Encounter (Signed)
Ok to refill 

## 2013-11-27 ENCOUNTER — Telehealth: Payer: Self-pay | Admitting: Adult Health

## 2013-11-27 NOTE — Telephone Encounter (Signed)
Mother came into office today.  POA/guardianship form scanned to documents.  Also has form that needs to be completed by Dr. Dan HumphreysWalker for pt to attend camp.  Placed in Raquel's box.  States she has to turn in by May 1.  Would like a call to let her know when ready for pickup

## 2013-11-30 NOTE — Telephone Encounter (Signed)
Pt called to check status of camp form.  Pt states she may have forgotten to sign it and she can come in to sign.  Please contact pt when ready for pick up and she will come in to sign. Needs by 5/1. ° °

## 2013-12-04 NOTE — Telephone Encounter (Signed)
Forms completed and given to Wm. Wrigley Jr. CompanyKay Morris

## 2014-02-18 ENCOUNTER — Telehealth: Payer: Self-pay | Admitting: Adult Health

## 2014-02-18 NOTE — Telephone Encounter (Signed)
Pt needs form filled out for camp. Form in Raqueal's box. Please call when ready for pick

## 2014-02-21 NOTE — Telephone Encounter (Signed)
Form filled out waiting for signature from Raquel before it is ready for pick up.

## 2014-02-28 ENCOUNTER — Telehealth: Payer: Self-pay | Admitting: Adult Health

## 2014-02-28 NOTE — Telephone Encounter (Signed)
Chad Stewart called and stated that Evelio's mood swings have gotten worst. She is having to pick him up from camp early due to mood swings. Mrs Langston MaskerMorris is requesting a referral for psychologist. Please advise Mrs Stewart/msn

## 2014-03-02 ENCOUNTER — Other Ambulatory Visit: Payer: Self-pay | Admitting: Adult Health

## 2014-03-02 DIAGNOSIS — R4586 Emotional lability: Secondary | ICD-10-CM

## 2014-03-06 ENCOUNTER — Encounter: Payer: Self-pay | Admitting: Adult Health

## 2014-03-06 ENCOUNTER — Ambulatory Visit (INDEPENDENT_AMBULATORY_CARE_PROVIDER_SITE_OTHER): Payer: Medicare Other | Admitting: Adult Health

## 2014-03-06 VITALS — BP 92/63 | HR 60 | Temp 98.1°F | Resp 16 | Ht 61.0 in | Wt 218.5 lb

## 2014-03-06 DIAGNOSIS — Z0000 Encounter for general adult medical examination without abnormal findings: Secondary | ICD-10-CM | POA: Insufficient documentation

## 2014-03-06 NOTE — Progress Notes (Signed)
Patient ID: Chad Stewart, male   DOB: 07/13/1966, 48 y.o.   MRN: [id number]  Subjective:    Patient ID: Chad Stewart, male    DOB: 07/13/1966, 48 y.o.   MRN: [id number]  HPI  Patient is a 48 year old male with history of Down syndrome who presents to clinic for physical exam and completion of an FL2 form. Patient is in his usual joking behavior. He presents to clinic with one of his caregivers. [facility]. Denies any concerns at present. He went to camp 2 weeks ago and got kicked out for cursing. He is in his usual jovial, joking mood.     Past Medical History  Diagnosis Date  . Down syndrome   . Chronic conjunctivitis   . History of pneumonia   . Onychomycosis      Past Surgical History  Procedure Laterality Date  . Entropian repair       No family history on file.   History   Social History  . Marital Status: Single    Spouse Name: N/A    Number of Children: N/A  . Years of Education: N/A   Occupational History  . Not on file.   Social History Main Topics  . Smoking status: Never Smoker   . Smokeless tobacco: Never Used  . Alcohol Use: No  . Drug Use: No  . Sexual Activity: Not on file   Other Topics Concern  . Not on file   Social History Narrative  . No narrative on file    Review of Systems  Unable to perform ROS Secondary to cognitive deficit. Pt reports "feeling fine"     Objective:   Physical Exam  Constitutional:  Overweight, pleasant male in no apparent distress  HENT:  Head: Normocephalic and atraumatic.  Nose: Nose normal.  Mouth/Throat: Oropharynx is clear and moist.  Bilateral ears are impacted with cerumen  Eyes: EOM are normal.  Left eye s/p entropion repair with scar tissue; chronic conjunctivitis  Neck: Normal range of motion. Neck supple. No tracheal deviation present. No thyromegaly present.  Cardiovascular: Normal rate, regular rhythm, normal heart sounds and intact distal pulses.  Exam  reveals no gallop and no friction rub.   No murmur heard. Pulmonary/Chest: Effort normal and breath sounds normal. No respiratory distress. He has no wheezes. He has no rales.  Abdominal: Soft. Bowel sounds are normal.  Musculoskeletal: Normal range of motion. He exhibits no edema and no tenderness.  Lymphadenopathy:    He has no cervical adenopathy.  Neurological: He is alert. Coordination normal.  Skin: Skin is warm and dry.  Onychomycosis of all toes  Psychiatric:  Patient is friendly. He is in his usual happy mood.     Assessment & Plan:   1. Routine general medical examination at a health care facility Scar tissue of the left eye following surgery for repair of entropion. Onychomycosis of all toes. This is not bothersome to pt. 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 Pt is stable and in good health. Repeat exam in 1 year. State Forms brought in to be filled out.

## 2014-03-06 NOTE — Progress Notes (Signed)
Pre visit review using our clinic review tool, if applicable. No additional management support is needed unless otherwise documented below in the visit note. 

## 2014-03-08 ENCOUNTER — Other Ambulatory Visit: Payer: Self-pay | Admitting: *Deleted

## 2014-03-08 MED ORDER — TOLNAFTATE 1 % EX AERP: INHALATION_SPRAY | Freq: Every day | CUTANEOUS | Status: DC

## 2014-03-29 ENCOUNTER — Other Ambulatory Visit: Payer: Self-pay | Admitting: Adult Health

## 2014-03-29 NOTE — Telephone Encounter (Signed)
Is it ok to Refill?

## 2014-04-01 ENCOUNTER — Other Ambulatory Visit: Payer: Self-pay

## 2014-04-01 MED ORDER — ALLOPURINOL 100 MG PO TABS: ORAL_TABLET | ORAL | Status: DC

## 2014-04-01 NOTE — Telephone Encounter (Signed)
It was for Allopurinol 100mg  daily

## 2014-04-01 NOTE — Telephone Encounter (Signed)
Chad Stewart, What medicine are they requesting?

## 2014-04-12 ENCOUNTER — Other Ambulatory Visit: Payer: Self-pay | Admitting: Adult Health

## 2014-05-08 ENCOUNTER — Other Ambulatory Visit: Payer: Self-pay | Admitting: *Deleted

## 2014-05-08 MED ORDER — FLUTICASONE PROPIONATE 50 MCG/ACT NA SUSP: NASAL | Status: AC

## 2014-06-07 ENCOUNTER — Telehealth: Payer: Self-pay | Admitting: *Deleted

## 2014-06-07 NOTE — Telephone Encounter (Signed)
Received personal care services ICD-10 transition form.  Completed and mailed back to [employer] [facility]

## 2014-06-21 ENCOUNTER — Other Ambulatory Visit: Payer: Self-pay | Admitting: *Deleted

## 2014-06-21 MED ORDER — ECONAZOLE NITRATE 1 % EX CREA
TOPICAL_CREAM | CUTANEOUS | Status: DC
Start: 2014-06-21 — End: 2014-07-16

## 2014-07-03 ENCOUNTER — Telehealth: Payer: Self-pay | Admitting: *Deleted

## 2014-07-03 NOTE — Telephone Encounter (Signed)
pts [facility] called [facility], [facility] no longer needs Econazole Nitrate Cream and Tolnaftate Spray for his feet.  She is requesting order and Rx to be discontinued.  Please advise

## 2014-07-04 NOTE — Telephone Encounter (Signed)
Fine to discontinue

## 2014-07-04 NOTE — Telephone Encounter (Signed)
Spoke with both pts sister and pharmacy, advised of MDs order to discontinue.

## 2014-07-16 ENCOUNTER — Ambulatory Visit (INDEPENDENT_AMBULATORY_CARE_PROVIDER_SITE_OTHER): Payer: Medicare Other | Admitting: Nurse Practitioner

## 2014-07-16 ENCOUNTER — Encounter: Payer: Self-pay | Admitting: Nurse Practitioner

## 2014-07-16 ENCOUNTER — Encounter (INDEPENDENT_AMBULATORY_CARE_PROVIDER_SITE_OTHER): Payer: Self-pay

## 2014-07-16 VITALS — BP 106/62 | HR 62 | Temp 98.4°F | Resp 14 | Ht 61.0 in | Wt 217.5 lb

## 2014-07-16 DIAGNOSIS — Z23 Encounter for immunization: Secondary | ICD-10-CM

## 2014-07-16 DIAGNOSIS — R21 Rash and other nonspecific skin eruption: Secondary | ICD-10-CM

## 2014-07-16 NOTE — Patient Instructions (Signed)
Failure to improve or worsening of rash, make appointment to be seen.  Happy Holidays!

## 2014-07-16 NOTE — Progress Notes (Signed)
Subjective:    Patient ID: Chad Stewart, male    DOB: 11/26/1965, 48 y.o.   MRN: [id number]  HPI  [name] is a 48 yo male with down syndrome who is here today to establish care.   Patient is here to meet me as his new provider. He is accompanied by one of his Owens & MinorCaregiver's Francine. He is in a jovial mood, talked about Thanksgiving, and his favorite Motorcycles.    He and his caregiver are requesting influenza vaccination.  No other complaints at today's visit.   Review of Systems Unable to complete.  Pt states "Feeling good".  Past Medical History  Diagnosis Date  . Down syndrome   . Chronic conjunctivitis   . History of pneumonia   . Onychomycosis     History   Social History  . Marital Status: Single    Spouse Name: N/A    Number of Children: N/A  . Years of Education: N/A   Occupational History  . Not on file.   Social History Main Topics  . Smoking status: Never Smoker   . Smokeless tobacco: Never Used  . Alcohol Use: No  . Drug Use: No  . Sexual Activity: Not on file   Other Topics Concern  . Not on file   Social History Narrative    Past Surgical History  Procedure Laterality Date  . Entropian repair      No family history on file.  Allergies  Allergen Reactions  . Carbapenems   . Cephalosporins   . Penicillamine   . Penicillins   . Sulfa Antibiotics     Current Outpatient Prescriptions on File Prior to Visit  Medication Sig Dispense Refill  . acetaminophen (TYLENOL) 325 MG tablet Take 2 tablets (650 mg total) by mouth every 4 (four) hours as needed for pain or fever. 90 tablet 6  . albuterol-ipratropium (COMBIVENT) 18-103 MCG/ACT inhaler Inhale 2 puffs into the lungs every 6 (six) hours as needed. For wheezing 14.7 g 6  . allopurinol (ZYLOPRIM) 100 MG tablet Start after completing the Naproxen. Take 1 tablet daily. 30 tablet 5  . Artificial Tear Ointment (REFRESH P.M. OP) Apply to eye 4 (four) times daily. Apply to left eye 4 times  daily and to right eye at bedtime.    . fluticasone (FLONASE) 50 MCG/ACT nasal spray 2 sprays in each nostril daily 16 g 11  . prednisoLONE acetate (PRED FORTE) 1 % ophthalmic suspension Place 1 drop into the left eye daily. 5 mL 6  . guaiFENesin (MUCINEX) 600 MG 12 hr tablet Take 2 tablets (1,200 mg total) by mouth 2 (two) times daily as needed for congestion. (Patient not taking: Reported on 07/16/2014) 90 tablet 2  . HYDROcodone-homatropine (HYCODAN) 5-1.5 MG/5ML syrup Take 5 mLs by mouth every 8 (eight) hours as needed for cough. (Patient not taking: Reported on 07/16/2014) 120 mL 0   No current facility-administered medications on file prior to visit.    BP 106/62 mmHg  Pulse 62  Temp(Src) 98.4 F (36.9 C) (Oral)  Resp 14  Ht 5\' 1"  (1.549 m)  Wt 217 lb 8 oz (98.657 kg)  BMI 41.12 kg/m2  SpO2 95%       Objective:   Physical Exam  Constitutional: He appears well-nourished. No distress.  HENT:  Facial characteristics of down syndrome. He has had previous entropion surgery Left eye. There is minimal scarring.   Cardiovascular: Normal rate and regular rhythm.   Pulmonary/Chest: Effort normal and breath sounds  normal.  Neurological: He is alert.  Skin: Skin is dry. Rash noted. He is not diaphoretic.  Dry patchy red rash in folds of antecubital spaces and axillae bilaterally.   Psychiatric:  Pleasant affect.            Assessment & Plan:

## 2014-07-16 NOTE — Assessment & Plan Note (Signed)
Caregiver Cyndia BentFrancine states that [name] does not dry off well. Possible fungal etiology. They are going to work on drying off well and using powder to absorb moisture. If worsening or failing to improve will make another appointment.

## 2014-07-16 NOTE — Progress Notes (Signed)
Pre visit review using our clinic review tool, if applicable. No additional management support is needed unless otherwise documented below in the visit note. 

## 2014-08-16 ENCOUNTER — Ambulatory Visit: Payer: Self-pay | Admitting: Internal Medicine

## 2014-08-24 ENCOUNTER — Inpatient Hospital Stay: Payer: Self-pay | Admitting: Internal Medicine

## 2014-08-24 LAB — CBC WITH DIFFERENTIAL/PLATELET
BASOS ABS: 0.1 10*3/uL (ref 0.0–0.1)
Basophil %: 0.4 %
Eosinophil #: 0 10*3/uL (ref 0.0–0.7)
Eosinophil %: 0 %
HCT: 47 % (ref 40.0–52.0)
HGB: 15 g/dL (ref 13.0–18.0)
LYMPHS PCT: 2.6 %
Lymphocyte #: 0.5 10*3/uL — ABNORMAL LOW (ref 1.0–3.6)
MCH: 32.3 pg (ref 26.0–34.0)
MCHC: 32 g/dL (ref 32.0–36.0)
MCV: 101 fL — ABNORMAL HIGH (ref 80–100)
Monocyte #: 0.8 x10 3/mm (ref 0.2–1.0)
Monocyte %: 4.6 %
NEUTROS PCT: 92.4 %
Neutrophil #: 16.6 10*3/uL — ABNORMAL HIGH (ref 1.4–6.5)
Platelet: 204 10*3/uL (ref 150–440)
RBC: 4.65 10*6/uL (ref 4.40–5.90)
RDW: 14.3 % (ref 11.5–14.5)
WBC: 17.9 10*3/uL — ABNORMAL HIGH (ref 3.8–10.6)

## 2014-08-24 LAB — COMPREHENSIVE METABOLIC PANEL
ANION GAP: 8 (ref 7–16)
Albumin: 2.9 g/dL — ABNORMAL LOW (ref 3.4–5.0)
Alkaline Phosphatase: 102 U/L
BILIRUBIN TOTAL: 0.5 mg/dL (ref 0.2–1.0)
BUN: 15 mg/dL (ref 7–18)
CREATININE: 1.71 mg/dL — AB (ref 0.60–1.30)
Calcium, Total: 8.8 mg/dL (ref 8.5–10.1)
Chloride: 101 mmol/L (ref 98–107)
Co2: 30 mmol/L (ref 21–32)
EGFR (African American): 56 — ABNORMAL LOW
EGFR (Non-African Amer.): 46 — ABNORMAL LOW
Glucose: 162 mg/dL — ABNORMAL HIGH (ref 65–99)
Osmolality: 282 (ref 275–301)
POTASSIUM: 4.3 mmol/L (ref 3.5–5.1)
SGOT(AST): 40 U/L — ABNORMAL HIGH (ref 15–37)
SGPT (ALT): 45 U/L
Sodium: 139 mmol/L (ref 136–145)
TOTAL PROTEIN: 8 g/dL (ref 6.4–8.2)

## 2014-08-24 LAB — PROTIME-INR
INR: 1.2
Prothrombin Time: 15.3 secs — ABNORMAL HIGH (ref 11.5–14.7)

## 2014-08-24 LAB — TROPONIN I
Troponin-I: 0.16 ng/mL — ABNORMAL HIGH
Troponin-I: 0.4 ng/mL — ABNORMAL HIGH

## 2014-08-24 LAB — MAGNESIUM: Magnesium: 2.3 mg/dL

## 2014-08-24 LAB — CK-MB
CK-MB: 0.7 ng/mL (ref 0.5–3.6)
CK-MB: 0.9 ng/mL (ref 0.5–3.6)

## 2014-08-24 LAB — PHOSPHORUS: PHOSPHORUS: 4.5 mg/dL (ref 2.5–4.9)

## 2014-08-25 LAB — CBC WITH DIFFERENTIAL/PLATELET
BASOS ABS: 0 10*3/uL (ref 0.0–0.1)
BASOS PCT: 0.2 %
EOS ABS: 0 10*3/uL (ref 0.0–0.7)
Eosinophil %: 0 %
HCT: 42.5 % (ref 40.0–52.0)
HGB: 13.5 g/dL (ref 13.0–18.0)
LYMPHS PCT: 2.5 %
Lymphocyte #: 0.4 10*3/uL — ABNORMAL LOW (ref 1.0–3.6)
MCH: 31.7 pg (ref 26.0–34.0)
MCHC: 31.7 g/dL — AB (ref 32.0–36.0)
MCV: 100 fL (ref 80–100)
Monocyte #: 0.4 x10 3/mm (ref 0.2–1.0)
Monocyte %: 2.1 %
Neutrophil #: 16.9 10*3/uL — ABNORMAL HIGH (ref 1.4–6.5)
Neutrophil %: 95.2 %
Platelet: 183 10*3/uL (ref 150–440)
RBC: 4.24 10*6/uL — ABNORMAL LOW (ref 4.40–5.90)
RDW: 14.2 % (ref 11.5–14.5)
WBC: 17.8 10*3/uL — AB (ref 3.8–10.6)

## 2014-08-25 LAB — CK-MB: CK-MB: 1.1 ng/mL (ref 0.5–3.6)

## 2014-08-25 LAB — URINALYSIS, COMPLETE
BILIRUBIN, UR: NEGATIVE
Bacteria: NONE SEEN
Glucose,UR: NEGATIVE mg/dL (ref 0–75)
Leukocyte Esterase: NEGATIVE
Nitrite: NEGATIVE
Ph: 6 (ref 4.5–8.0)
SPECIFIC GRAVITY: 1.025 (ref 1.003–1.030)
SQUAMOUS EPITHELIAL: NONE SEEN
WBC UR: 5 /HPF (ref 0–5)

## 2014-08-25 LAB — BASIC METABOLIC PANEL
Anion Gap: 9 (ref 7–16)
BUN: 17 mg/dL (ref 7–18)
Calcium, Total: 8.3 mg/dL — ABNORMAL LOW (ref 8.5–10.1)
Chloride: 108 mmol/L — ABNORMAL HIGH (ref 98–107)
Co2: 27 mmol/L (ref 21–32)
Creatinine: 1.42 mg/dL — ABNORMAL HIGH (ref 0.60–1.30)
EGFR (African American): >60
EGFR (Non-African Amer.): 57 — ABNORMAL LOW
GLUCOSE: 167 mg/dL — AB (ref 65–99)
Osmolality: 292 (ref 275–301)
Potassium: 4.8 mmol/L (ref 3.5–5.1)
Sodium: 144 mmol/L (ref 136–145)

## 2014-08-25 LAB — TROPONIN I: Troponin-I: 0.2 ng/mL — ABNORMAL HIGH

## 2014-08-26 LAB — CBC WITH DIFFERENTIAL/PLATELET
BASOS ABS: 0 10*3/uL (ref 0.0–0.1)
Basophil %: 0.1 %
EOS ABS: 0 10*3/uL (ref 0.0–0.7)
Eosinophil %: 0 %
HCT: 39 % — ABNORMAL LOW (ref 40.0–52.0)
HGB: 12.8 g/dL — AB (ref 13.0–18.0)
LYMPHS PCT: 1.8 %
Lymphocyte #: 0.5 10*3/uL — ABNORMAL LOW (ref 1.0–3.6)
MCH: 32.4 pg (ref 26.0–34.0)
MCHC: 32.9 g/dL (ref 32.0–36.0)
MCV: 99 fL (ref 80–100)
Monocyte #: 0.6 x10 3/mm (ref 0.2–1.0)
Monocyte %: 2.4 %
Neutrophil #: 25.2 10*3/uL — ABNORMAL HIGH (ref 1.4–6.5)
Neutrophil %: 95.7 %
Platelet: 199 10*3/uL (ref 150–440)
RBC: 3.95 10*6/uL — ABNORMAL LOW (ref 4.40–5.90)
RDW: 14.1 % (ref 11.5–14.5)
WBC: 26.4 10*3/uL — ABNORMAL HIGH (ref 3.8–10.6)

## 2014-08-26 LAB — BASIC METABOLIC PANEL
Anion Gap: 7 (ref 7–16)
BUN: 23 mg/dL — ABNORMAL HIGH (ref 7–18)
CREATININE: 1.44 mg/dL — AB (ref 0.60–1.30)
Calcium, Total: 8.5 mg/dL (ref 8.5–10.1)
Chloride: 107 mmol/L (ref 98–107)
Co2: 26 mmol/L (ref 21–32)
EGFR (Non-African Amer.): 56 — ABNORMAL LOW
GLUCOSE: 269 mg/dL — AB (ref 65–99)
Osmolality: 293 (ref 275–301)
POTASSIUM: 4.1 mmol/L (ref 3.5–5.1)
SODIUM: 140 mmol/L (ref 136–145)

## 2014-08-27 ENCOUNTER — Other Ambulatory Visit: Payer: Self-pay | Admitting: *Deleted

## 2014-08-27 LAB — CBC WITH DIFFERENTIAL/PLATELET
Basophil #: 0 10*3/uL (ref 0.0–0.1)
Basophil %: 0.2 %
Eosinophil #: 0 10*3/uL (ref 0.0–0.7)
Eosinophil %: 0 %
HCT: 36.8 % — ABNORMAL LOW (ref 40.0–52.0)
HGB: 11.9 g/dL — ABNORMAL LOW (ref 13.0–18.0)
LYMPHS ABS: 1 10*3/uL (ref 1.0–3.6)
Lymphocyte %: 5.1 %
MCH: 31.9 pg (ref 26.0–34.0)
MCHC: 32.4 g/dL (ref 32.0–36.0)
MCV: 99 fL (ref 80–100)
Monocyte #: 0.8 x10 3/mm (ref 0.2–1.0)
Monocyte %: 4.4 %
Neutrophil #: 17.1 10*3/uL — ABNORMAL HIGH (ref 1.4–6.5)
Neutrophil %: 90.3 %
Platelet: 202 10*3/uL (ref 150–440)
RBC: 3.73 10*6/uL — ABNORMAL LOW (ref 4.40–5.90)
RDW: 14 % (ref 11.5–14.5)
WBC: 18.9 10*3/uL — AB (ref 3.8–10.6)

## 2014-08-27 LAB — BASIC METABOLIC PANEL
Anion Gap: 6 — ABNORMAL LOW (ref 7–16)
BUN: 20 mg/dL — ABNORMAL HIGH (ref 7–18)
CHLORIDE: 110 mmol/L — AB (ref 98–107)
Calcium, Total: 7.7 mg/dL — ABNORMAL LOW (ref 8.5–10.1)
Co2: 28 mmol/L (ref 21–32)
Creatinine: 1.27 mg/dL (ref 0.60–1.30)
EGFR (African American): >60
EGFR (Non-African Amer.): >60
Glucose: 172 mg/dL — ABNORMAL HIGH (ref 65–99)
OSMOLALITY: 294 (ref 275–301)
POTASSIUM: 3.9 mmol/L (ref 3.5–5.1)
Sodium: 144 mmol/L (ref 136–145)

## 2014-08-27 LAB — URINE CULTURE

## 2014-08-27 LAB — TRIGLYCERIDES: Triglycerides: 175 mg/dL (ref 0–200)

## 2014-08-27 LAB — PHOSPHORUS: Phosphorus: 2 mg/dL — ABNORMAL LOW (ref 2.5–4.9)

## 2014-08-27 LAB — MAGNESIUM: Magnesium: 2.1 mg/dL

## 2014-08-27 LAB — HEMOGLOBIN A1C: Hemoglobin A1C: 6.2 % (ref 4.2–6.3)

## 2014-08-27 MED ORDER — ALLOPURINOL 100 MG PO TABS: ORAL_TABLET | ORAL | Status: AC

## 2014-08-28 LAB — CBC WITH DIFFERENTIAL/PLATELET
Bands: 3 %
Comment - H1-Com1: NORMAL
HCT: 38.6 % — ABNORMAL LOW
HGB: 12.5 g/dL — ABNORMAL LOW
Lymphocytes: 3 %
MCH: 31.7 pg
MCHC: 32.4 g/dL
MCV: 98 fL
Metamyelocyte: 2 %
Monocytes: 3 %
Myelocyte: 1 %
Platelet: 237 x10 3/mm 3
RBC: 3.95 x10 6/mm 3 — ABNORMAL LOW
RDW: 14.3 %
Segmented Neutrophils: 88 %
WBC: 18.8 x10 3/mm 3 — ABNORMAL HIGH

## 2014-08-28 LAB — MAGNESIUM: Magnesium: 2.2 mg/dL

## 2014-08-28 LAB — BASIC METABOLIC PANEL WITH GFR
Anion Gap: 6 — ABNORMAL LOW
BUN: 21 mg/dL — ABNORMAL HIGH
Calcium, Total: 8 mg/dL — ABNORMAL LOW
Chloride: 109 mmol/L — ABNORMAL HIGH
Co2: 28 mmol/L
Creatinine: 1.05 mg/dL
EGFR (African American): >60
EGFR (Non-African Amer.): >60
Glucose: 148 mg/dL — ABNORMAL HIGH
Osmolality: 291
Potassium: 3.7 mmol/L
Sodium: 143 mmol/L

## 2014-08-28 LAB — PHOSPHORUS: PHOSPHORUS: 2.4 mg/dL — AB (ref 2.5–4.9)

## 2014-08-28 LAB — EXPECTORATED SPUTUM ASSESSMENT W REFEX TO RESP CULTURE

## 2014-08-28 LAB — BRONCHIAL WASH CULTURE

## 2014-08-29 LAB — BASIC METABOLIC PANEL
ANION GAP: 5 — AB (ref 7–16)
BUN: 19 mg/dL — ABNORMAL HIGH (ref 7–18)
CALCIUM: 7.6 mg/dL — AB (ref 8.5–10.1)
Chloride: 106 mmol/L (ref 98–107)
Co2: 31 mmol/L (ref 21–32)
Creatinine: 0.97 mg/dL (ref 0.60–1.30)
GLUCOSE: 145 mg/dL — AB (ref 65–99)
Osmolality: 288 (ref 275–301)
Potassium: 3.8 mmol/L (ref 3.5–5.1)
Sodium: 142 mmol/L (ref 136–145)

## 2014-08-29 LAB — CBC WITH DIFFERENTIAL/PLATELET
BANDS NEUTROPHIL: 4 %
Basophil: 1 %
Eosinophil: 2 %
HCT: 40.7 % (ref 40.0–52.0)
HGB: 13.4 g/dL (ref 13.0–18.0)
Lymphocytes: 9 %
MCH: 32 pg (ref 26.0–34.0)
MCHC: 32.9 g/dL (ref 32.0–36.0)
MCV: 98 fL (ref 80–100)
MONOS PCT: 3 %
Metamyelocyte: 5 %
Myelocyte: 6 %
Platelet: 265 10*3/uL (ref 150–440)
RBC: 4.17 10*6/uL — AB (ref 4.40–5.90)
RDW: 14.7 % — ABNORMAL HIGH (ref 11.5–14.5)
Segmented Neutrophils: 70 %
WBC: 17.8 10*3/uL — AB (ref 3.8–10.6)

## 2014-08-29 LAB — CLOSTRIDIUM DIFFICILE(ARMC)

## 2014-08-29 LAB — CULTURE, BLOOD (SINGLE)

## 2014-08-29 LAB — VANCOMYCIN, TROUGH: Vancomycin, Trough: 10 ug/mL (ref 10–20)

## 2014-08-29 LAB — MAGNESIUM: MAGNESIUM: 2.3 mg/dL

## 2014-08-29 LAB — PHOSPHORUS: Phosphorus: 3.2 mg/dL (ref 2.5–4.9)

## 2014-08-30 LAB — BASIC METABOLIC PANEL
Anion Gap: 4 — ABNORMAL LOW (ref 7–16)
BUN: 34 mg/dL — ABNORMAL HIGH (ref 7–18)
CREATININE: 2.05 mg/dL — AB (ref 0.60–1.30)
Calcium, Total: 8.1 mg/dL — ABNORMAL LOW (ref 8.5–10.1)
Chloride: 107 mmol/L (ref 98–107)
Co2: 34 mmol/L — ABNORMAL HIGH (ref 21–32)
EGFR (African American): 45 — ABNORMAL LOW
GFR CALC NON AF AMER: 37 — AB
GLUCOSE: 88 mg/dL (ref 65–99)
Osmolality: 296 (ref 275–301)
Potassium: 4.1 mmol/L (ref 3.5–5.1)
SODIUM: 145 mmol/L (ref 136–145)

## 2014-08-30 LAB — CBC WITH DIFFERENTIAL/PLATELET
Comment - H1-Com4: NORMAL
Eosinophil: 1 %
HCT: 44.5 % (ref 40.0–52.0)
HGB: 14.1 g/dL (ref 13.0–18.0)
Lymphocytes: 12 %
MCH: 32.2 pg (ref 26.0–34.0)
MCHC: 31.7 g/dL — ABNORMAL LOW (ref 32.0–36.0)
MCV: 101 fL — AB (ref 80–100)
METAMYELOCYTE: 1 %
Monocytes: 3 %
Platelet: 242 10*3/uL (ref 150–440)
RBC: 4.38 10*6/uL — ABNORMAL LOW (ref 4.40–5.90)
RDW: 14.8 % — ABNORMAL HIGH (ref 11.5–14.5)
SEGMENTED NEUTROPHILS: 83 %
WBC: 13.1 10*3/uL — AB (ref 3.8–10.6)

## 2014-09-02 ENCOUNTER — Telehealth: Payer: Self-pay | Admitting: Nurse Practitioner

## 2014-09-02 NOTE — Telephone Encounter (Signed)
Pt called to inform that pt passed away on 01/09/15 from complications with pneumonia.msn

## 2014-09-02 NOTE — Telephone Encounter (Signed)
Patient passed away over the weekend.

## 2014-09-16 ENCOUNTER — Ambulatory Visit: Payer: Self-pay | Admitting: Internal Medicine

## 2014-09-16 DEATH — deceased

## 2014-12-15 NOTE — Consult Note (Signed)
   Comments   I met with pt's sister/HCPOA. She agrees that a weaning trial prior to extubation will be very difficult for pt and understands that pt's weaning trial will be abbreviated. Sister plans to be present for extubation to assist in keeping pt calm. We discussed the options for post-extubation in the event pt declines. Sister understands that she can choose re-intubation or comfort care. She is clearly struggling with decision and needs time to think about it. Sister will be at hospital again tomorrow. Will meet with her again to establish goals of therapy.   Electronic Signatures: Shyanne Mcclary, Izora Gala (MD)  (Signed 13-Jan-16 11:48)  Authored: Palliative Care   Last Updated: 13-Jan-16 11:48 by Jj Enyeart, Izora Gala (MD)

## 2014-12-15 NOTE — Op Note (Signed)
PATIENT NAME:  Chad Stewart, [name] MR#:  [id number] DATE OF BIRTH:  07-03-1966  DATE OF PROCEDURE:  08/25/2014  PREOPERATIVE DIAGNOSES:  1.  Respiratory failure requiring intubation.  2.  Down syndrome.  3.  Poor venous access.   POSTOPERATIVE DIAGNOSES: 1.  Respiratory failure requiring intubation.  2.  Down syndrome.  3.  Poor venous access.   PROCEDURES:  1.  Ultrasound guidance for vascular access, right jugular vein.  2.  Placement of right jugular triple-lumen catheter.   SURGEON: Annice NeedyJason S. Hisao Doo, MD   ANESTHESIA: Local.   ESTIMATED BLOOD LOSS: Minimal.   INDICATION FOR PROCEDURE: A 49 year old gentleman with Down syndrome who was admitted with acute respiratory failure. He has been intubated. He has poor venous access and we are asked to place a central line.   DESCRIPTION OF PROCEDURE: The patient is laid flat in the critical care bed. The right neck was sterilely prepped and draped and a sterile surgical field was created. The right jugular vein was visualized with ultrasound and found to be widely patent. It was accessed under direct ultrasound guidance without difficulty with a Seldinger needle. A J-wire was placed after skin nick and dilatation. A triple-lumen catheter was placed over the wire and the wire was removed. All 3 lumens withdrew dark red and nonpulsatile blood and flushed easily with sterile saline. It was secured at about 17 cm at the skin with 3 silk sutures. Sterile dressing was placed. Stat chest x-ray is pending.    ____________________________ Annice NeedyJason S. Idil Maslanka, MD jsd:TT D: 08/25/2014 15:08:45 ET T: 08/25/2014 19:48:01 ET JOB#: [id number]  cc: Annice NeedyJason S. Marian Grandt, MD, <Dictator> Annice NeedyJASON S Mareta Chesnut MD ELECTRONICALLY SIGNED 08/27/2014 13:39

## 2014-12-15 NOTE — H&P (Signed)
PATIENT NAME:  Chad Stewart, Chad Stewart MR#:  [id number] DATE OF BIRTH:  30-Apr-1966  DATE OF ADMISSION:  08/24/2014  REFERRING PHYSICIAN: Kathreen Devoid. Paduchowski, MD.   FAMILY PHYSICIAN: Ginette Pitman. Dan Humphreys, MD.   REASON FOR ADMISSION: Acute respiratory failure.   HISTORY OF PRESENT ILLNESS: The patient is a 49 year old male with Down syndrome who resides at a family care home. Brought to the emergency room with cough and congestion and lethargy. In the emergency room, the patient was noted to be tachycardic, febrile and extremely hypoxic. He was placed on face mask oxygen. Chest x-ray reveals pneumonia. He was also noted to have an elevated troponin. The patient is lethargic with Down syndrome and unable to give a history of this time. He is now admitted for further evaluation.   PAST MEDICAL HISTORY: 1. Down syndrome.  2. Mental retardation.  3. History of pneumonia.  4. Gout.  5. Previous left eye surgery.   MEDICATIONS: 1. Pred Forte 1% one drop in the left eye b.i.d.  2. Combivent 1 puff 4 times daily.  3. Symbicort 2 puffs b.i.d.  4. Allopurinol 100 mg p.o. daily.   ALLERGIES: PENICILLIN, SULFA.   SOCIAL HISTORY: Negative for alcohol or tobacco abuse.   FAMILY HISTORY: Positive for hypertension, but otherwise unknown.   REVIEW OF SYSTEMS: Unable to obtain from the patient.   PHYSICAL EXAMINATION: GENERAL: The patient is lethargic, in moderate respiratory distress.   VITAL SIGNS: Currently remarkable for a blood pressure of 99/58 with a heart rate of 118, respiratory rate of 33, temperature 98.8 and a saturation of 92% on oxygen.   HEENT: Normocephalic, atraumatic. Pupils equally round and reactive to light and accommodation. Extraocular movements are intact. Sclerae anicteric. Conjunctivae are clear. Oropharynx is dry, but clear.   NECK: Supple without JVD. No adenopathy or thyromegaly is noted.   LUNGS: Reveal diffuse rhonchi with scattered wheezes. No rales. No dullness.  Respiratory effort is increased.   CARDIAC: Exam reveals a rapid rate with a regular rhythm. Normal S1, S2. No significant rubs or gallops. PMI is nondisplaced. Chest wall is nontender.   ABDOMEN: Soft, nontender with normoactive bowel sounds. No organomegaly or masses were appreciated. No hernias or bruits were noted.   EXTREMITIES: Reveal 1+ edema. Pulses were 2+ bilaterally.   SKIN: Warm and dry without rash or lesions.   NEUROLOGIC: Cranial nerves 2 through 12 grossly intact. Deep tendon reflexes were symmetric. Motor and sensory examination is nonfocal.   PSYCHIATRIC: Revealed a patient who is lethargic but would respond to painful stimuli.   LABORATORY DATA: EKG reveals a sinus tachycardia with no acute ischemic changes. Chest x-ray revealed acute pneumonia. His glucose is 162 with a creatinine of 1.71 and a GFR of 46. Troponin was elevated at 0.61. White count was 17.9 with a hemoglobin of 15.   ASSESSMENT: 1. Acute respiratory failure.  2. Pneumonia.  3. Elevated troponin of unclear etiology.  4. Down syndrome.  5. Hyperglycemia.  6. Renal insufficiency.   PLAN: The patient will be admitted to telemetry as a full code with oxygen, 1 dose of intravenous steroids, intravenous antibiotics and DuoNeb small volume nebulizers. We will follow serial cardiac enzymes and obtain an echocardiogram. Will consult cardiology. We will supplement oxygen and wean as tolerated. Follow-up chest x-ray and labs in the morning. Follow his sugars with Accu-Cheks before meals and at bedtime and add sliding scale insulin as needed. Further treatment and evaluation will depend upon the patient's progress.   TOTAL TIME  SPENT ON THIS PATIENT: 50 minutes.    ____________________________ Duane LopeJeffrey D. Judithann SheenSparks, MD jds:TT D: 08/24/2014 19:01:31 ET T: 08/24/2014 20:16:18 ET JOB#: [id number]  cc: Duane LopeJeffrey D. Judithann SheenSparks, MD, <Dictator> Ginette PitmanJennifer A. Dan HumphreysWalker, MD Jionni Rodena Medin Chalise Pe MD ELECTRONICALLY SIGNED 08/25/2014 14:08

## 2014-12-15 NOTE — Discharge Summary (Signed)
PATIENT NAME:  Chad Stewart, Chad Stewart MR#:  [id number] DATE OF BIRTH:  07/05/1966  DATE OF ADMISSION:  08/24/2014 DATE OF DISCHARGE:  04-08-2015  ADMITTING DIAGNOSES:  1.  Acute respiratory failure secondary to bilateral pneumonia.  2.  Sepsis due to bilateral pneumonia.  3.  Elevated troponin due to demand ischemia.  4.  Down syndrome.  5.  Hyperlipidemia.  6.  Acute renal failure due to acute tubular necrosis.  7.  Hyperglycemia due to steroids.  8.  Acute on chronic obstructive pulmonary exacerbation.   CONSULTANTS: Dr. Belia HemanKasa, Dr. Welton FlakesKhan, palliative Dr. Harvie JuniorPhifer.   HOSPITAL COURSE: Please refer to H and P done by the admitting doctor. The patient is a 49 year old white male who has Down syndrome, who is brought in with respiratory difficulties. The patient was admitted on 08/24/2014 with bilateral pneumonia associated with sepsis. He was also noted to have acute renal failure. The patient developed worsening respiratory failure after admission and was intubated 08/25/2014. The patient underwent bronchoscopy with copious secretions. He also required pressors due to sepsis due to pneumonia. The patient remained intubated. He has a sister who is his health care power of attorney who made all of the decisions. The patient was finally extubated. However, he continued to do poorly off the ventilator. He tried to pull the ET tube out. He would not let the nurses leave anything on his body. He continued to require high flow oxygen. On further discussion with his sister, due to the patient's Down syndrome and limited mental capability, he does not like anything in his body and she does not wish for him to have a tracheostomy because he will pull that out. Due to his poor prognosis and failure to improve, he was made comfort care and was transferred to hospice home.   MEDICATIONS: At the time of transfer, lorazepam 0.5 one to two tablets sublingual every 2 to 4 hours as needed, morphine 20 mg/mL 0.25 mL q. 1 to 2  hours as needed, Zofran 4 mg q. 6 p.r.n. for nausea, haloperidol 2.5 mL 3 times a day.   DISPOSITION: To hospice home.   TIME SPENT: 35 minutes.    ____________________________ Lacie ScottsShreyang H. Allena KatzPatel, MD shp:at D: 09/10/2014 12:24:11 ET T: 09/10/2014 13:18:25 ET JOB#: [id number]  cc: Chanie Soucek H. Allena KatzPatel, MD, <Dictator> Charise CarwinSHREYANG H Lorette Peterkin MD ELECTRONICALLY SIGNED 09/12/2014 10:00

## 2014-12-15 NOTE — Consult Note (Signed)
CHIEF COMPLAINT and HISTORY:  Subjective/Chief Complaint respiratory failure   History of Present Illness 49 yo with Chad Stewart is admitted yesterday with pneumonia and worsened on the floor.  Transferred to CCU and intubated this afternoon.  Poor venous access and we are asked to place a line   PAST MEDICAL/SURGICAL HISTORY:  Past Medical History:   pnuemonia:    Mentally Handicapped:    Down's Syndrome:    Eye Surgery - Left:   ALLERGIES:  Allergies:  Sulfa drugs: Rash  PCN: Rash  HOME MEDICATIONS:  Home Medications: Medication Instructions Status  allopurinol 100 mg oral tablet 1 tab(s) orally once a day Active  acetaminophen 325 mg oral tablet 2  orally every 4 hours as needed   Active  Robitussin 100 mg/5 mL oral liquid 1  orally every 4 hours as needed  for cough Active  guaifenesin 1200 mg oral tablet, extended release 1  orally 2 times a day  Active  Pred Forte 1% ophthalmic suspension 1 drop in left eye 2 times a day  Active  Combivent 18 mcg-103 mcg/inh inhalation aerosol with adapter 1  inhaled every 4 hours as needed  for acute wheezes Active  budesonide-formoterol 160 mcg-4.5 mcg/inh inhalation aerosol with adapter 2 puff(s) inhaled 2 times a day  Active   Family and Social History:  Family History Non-Contributory   Social History negative tobacco, negative ETOH, negative Illicit drugs   Review of Systems:  ROS Pt not able to provide ROS   Medications/Allergies Reviewed Medications/Allergies reviewed   Physical Exam:  GEN well developed, well nourished, obese, critically ill appearing, typical downs appearance   HEENT pink conjunctivae, moist oral mucosa   NECK supple  No masses  trachea midline   RESP rhonchi  on vent   CARD regular rate  no JVD   VASCULAR ACCESS none   ABD denies tenderness  soft   GU no superpubic tenderness  Nursing placing Foley at this time   LYMPH negative neck, negative axillae   EXTR positive edema   SKIN normal to  palpation, skin turgor good   NEURO negative Babinski L, unable to assess, sedated   PSYCH sedated   LABS:  Laboratory Results: Hepatic:    09-Jan-16 17:21, Comprehensive Metabolic Panel  Bilirubin, Total 0.5  Alkaline Phosphatase 102  46-116  NOTE: New Reference Range  03/05/14  SGPT (ALT) 45  14-63  NOTE: New Reference Range  03/05/14  SGOT (AST) 40  Total Protein, Serum 8.0  Albumin, Serum 2.9  Routine Micro:    09-Jan-16 17:21, Blood Culture  Micro Text Report   BLOOD CULTURE    COMMENT                   NO GROWTH IN 8-12 HOURS     ANTIBIOTIC  Culture Comment   NO GROWTH IN 8-12 HOURS   Result(s) reported on 25 Aug 2014 at 01:00AM.  Micro Text Report   BLOOD CULTURE    COMMENT                   NO GROWTH IN 8-12 HOURS     ANTIBIOTIC  Culture Comment   NO GROWTH IN 8-12 HOURS   Result(s) reported on 25 Aug 2014 at 01:00AM.  Lab:    10-Jan-16 12:25, ABG and Lactic Acid  pH (ABG) 7.260  7.350-7.450  NOTE: New Reference Range  03/09/14  PCO2 64  32-48  NOTE: New Reference Range  03/26/14  PO2 73  83-108  NOTE: New Reference Range  03/09/14  FiO2 75  Base Excess 0  -3-3  NOTE: New Reference Range  03/26/14  HCO3 28.7  21.0-28.0  NOTE: New Reference Range  03/09/14  O2 Saturation 94.6  O2 Device hfnc  Specimen Site (ABG)   RT RADIAL  Specimen Type (ABG) ARTERIAL  Patient Temp (ABG) 37.0  Result(s) reported on 25 Aug 2014 at 12:36PM.  Routine Chem:    09-Jan-16 17:21, Comprehensive Metabolic Panel  Glucose, Serum 162  BUN 15  Creatinine (comp) 1.71  Sodium, Serum 139  Potassium, Serum 4.3  Chloride, Serum 101  CO2, Serum 30  Calcium (Total), Serum 8.8  Osmolality (calc) 282  eGFR (African American) 56  eGFR (Non-African American) 46  eGFR values <37m/min/1.73 m2 may be an indication of chronic  kidney disease (CKD).  Calculated eGFR, using the MRDR Study equation, is useful in   patients with stable renal function.  The eGFR calculation  will not be reliable in acutely ill patients  when serum creatinine is changing rapidly. It is not useful in  patients on dialysis. The eGFR calculation may not be applicable  to patients at the low and high extremes of body sizes, pregnant  women, and vegetarians.  Anion Gap 8    09-Jan-16 17:21, Magnesium, Serum  Magnesium, Serum 2.3  1.8-2.4  THERAPEUTIC RANGE: 4-7 mg/dL  TOXIC: > 10 mg/dL   -----------------------    09-Jan-16 17:21, Phosphorus, Serum  Phosphorus, Serum 4.5  Result(s) reported on 24 Aug 2014 at [facility]    09-Jan-16 17:21, Troponin I  Result Comment   TROPONIN - RESULTS VERIFIED BY REPEAT TESTING.   - SPOKE WITH JENNIFER JONES AT 1803 08/24/14   - READ-BACK PROCESS PERFORMED.   - SDR   Result(s) reported on 24 Aug 2014 at 05:41PM.    09-Jan-16 20:22, Troponin I  Result Comment   TROPONIN - RESULTS VERIFIED BY REPEAT TESTING.   - HIGH PREVIOUSLY CALLED BY SDR AT 1803 ON   - 08-24-14/RWW   Result(s) reported on 24 Aug 2014 at 09:11PM.    10-Jan-16 047:82 Basic Metabolic Panel (w/Total Calcium)  Glucose, Serum 167  BUN 17  Creatinine (comp) 1.42  Sodium, Serum 144  Potassium, Serum 4.8  Chloride, Serum 108  CO2, Serum 27  Calcium (Total), Serum 8.3  Anion Gap 9  Osmolality (calc) 292  eGFR (African American) >60  eGFR (Non-African American) 57  eGFR values <631mmin/1.73 m2 may be an indication of chronic  kidney disease (CKD).  Calculated eGFR, using the MRDR Study equation, is useful in   patients with stable renal function.  The eGFR calculation will not be reliable in acutely ill patients  when serum creatinine is changing rapidly. It is not useful in  patients on dialysis. The eGFR calculation may not be applicable  to patients at the low and high extremes of body sizes, pregnant  women, and vegetarians.    10-Jan-16 00:40, Troponin I  Result Comment   TROPONIN - RESULTS VERIFIED BY REPEAT TESTING.   - HIGH PREVIOUSLY CALLED BY SDR AT 1803 ON    - 08-24-14/RWW   Result(s) reported on 25 Aug 2014 at 02:27AM.  Cardiac:    09-Jan-16 17:21, CPK-MB, Serum  CPK-MB, Serum 0.7  Result(s) reported on 24 Aug 2014 at 10:20PM.    09-Jan-16 17:21, Troponin I  Troponin I 0.16  0.00-0.05  0.05 ng/mL or less: NEGATIVE   Repeat testing in 3-6 hrs   if clinically indicated.  >  0.05 ng/mL: POTENTIAL   MYOCARDIAL INJURY. Repeat   testing in 3-6 hrs if   clinically indicated.  NOTE: An increase or decrease   of 30% or more on serial   testing suggests a   clinically important change    09-Jan-16 20:22, CPK-MB, Serum  CPK-MB, Serum 0.9  Result(s) reported on 24 Aug 2014 at 09:08PM.    09-Jan-16 20:22, Troponin I  Troponin I 0.40  0.00-0.05  0.05 ng/mL or less: NEGATIVE   Repeat testing in 3-6 hrs   if clinically indicated.  >0.05 ng/mL: POTENTIAL   MYOCARDIAL INJURY. Repeat   testing in 3-6 hrs if   clinically indicated.  NOTE: An increase or decrease   of 30% or more on serial   testing suggests a   clinically important change    10-Jan-16 00:40, CPK-MB, Serum  CPK-MB, Serum 1.1  Result(s) reported on 25 Aug 2014 at 02:21AM.    10-Jan-16 00:40, Troponin I  Troponin I 0.20  0.00-0.05  0.05 ng/mL or less: NEGATIVE   Repeat testing in 3-6 hrs   if clinically indicated.  >0.05 ng/mL: POTENTIAL   MYOCARDIAL INJURY. Repeat   testing in 3-6 hrs if   clinically indicated.  NOTE: An increase or decrease   of 30% or more on serial   testing suggests a   clinically important change  Routine Coag:    09-Jan-16 17:21, Prothrombin Time  Prothrombin 15.3  INR 1.2  INR reference interval applies to patients on anticoagulant therapy.  A single INR therapeutic range for coumarins is not optimal for all  indications; however, the suggested range for most indications is  2.0 - 3.0.  Exceptions to the INR Reference Range may include: Prosthetic heart  valves, acute myocardial infarction, prevention of myocardial  infarction, and  combinations of aspirin and anticoagulant. The need  for a higher or lower target INR must be assessed individually.  Reference: The Pharmacology and Management of the Vitamin K   antagonists: the seventh ACCP Conference on Antithrombotic and  Thrombolytic Therapy. HAFBX.0383 Sept:126 (3suppl): [id number].  A HCT value >55% may artifactually increase the PT.  In one study,   the increase was an average of 25%.  Reference:  "Effect on Routine and Special Coagulation Testing Values  of Citrate Anticoagulant Adjustment in Patients with High HCT Values."  American Journal of Clinical Pathology 2006;126:400-405.  Routine Hem:    09-Jan-16 17:21, CBC Profile  WBC (CBC) 17.9  RBC (CBC) 4.65  Hemoglobin (CBC) 15.0  Hematocrit (CBC) 47.0  Platelet Count (CBC) 204  MCV 101  MCH 32.3  MCHC 32.0  RDW 14.3  Neutrophil % 92.4  Lymphocyte % 2.6  Monocyte % 4.6  Eosinophil % 0.0  Basophil % 0.4  Neutrophil # 16.6  Lymphocyte # 0.5  Monocyte # 0.8  Eosinophil # 0.0  Basophil # 0.1  Result(s) reported on 24 Aug 2014 at 05:33PM.    10-Jan-16 00:40, CBC Profile  WBC (CBC) 17.8  RBC (CBC) 4.24  Hemoglobin (CBC) 13.5  Hematocrit (CBC) 42.5  Platelet Count (CBC) 183  MCV 100  MCH 31.7  MCHC 31.7  RDW 14.2  Neutrophil % 95.2  Lymphocyte % 2.5  Monocyte % 2.1  Eosinophil % 0.0  Basophil % 0.2  Neutrophil # 16.9  Lymphocyte # 0.4  Monocyte # 0.4  Eosinophil # 0.0  Basophil # 0.0  Result(s) reported on 25 Aug 2014 at 01:46AM.   RADIOLOGY:  Radiology Results: XRay:    09-Jan-16 17:30,  Chest Portable Single View  Chest Portable Single View  REASON FOR EXAM:    Sepsis  COMMENTS:       PROCEDURE: DXR - DXR PORTABLE CHEST SINGLE VIEW  - Aug 24 2014  5:30PM     CLINICAL DATA:  Cough and congestion over several days. Group home  resident    EXAM:  PORTABLE CHEST - 1 VIEW    COMPARISON:  CT 05/12/2013, chest radiograph 01/08/2011, and  05/12/2013    FINDINGS:  Normal cardiac  silhouette. There is a right perihilar opacity which  obscures the right hilum and right heart border. No significant  atelectasis. No pleural fluid. No pneumothorax.    IMPRESSION:  Right perihilar opacity is most concerning for pneumonia. Recommend  follow-up chest radiographs after appropriate therapy to exclude  mass lesion.      Electronically Signed    By: Suzy Bouchard M.D.    On: 08/24/2014 18:12       Verified By: Rennis Golden, M.D.,  LabUnknown:  PACS Image   ASSESSMENT AND PLAN:  Assessment/Admission Diagnosis respiratory failure.  poor venous access downs syndrome   Plan central line placed at bedside without difficulty   level 3 consult   Electronic Signatures: Algernon Huxley (MD)  (Signed 10-Jan-16 15:03)  Authored: Chief Complaint and History, PAST MEDICAL/SURGICAL HISTORY, ALLERGIES, HOME MEDICATIONS, Family and Social History, Review of Systems, Physical Exam, LABS, RADIOLOGY, Assessment and Plan   Last Updated: 10-Jan-16 15:03 by Algernon Huxley (MD)

## 2014-12-15 NOTE — Consult Note (Signed)
PATIENT NAME:  Chad Stewart, Chad Stewart MR#:  [id number] DATE OF BIRTH:  April 14, 1966  DATE OF CONSULTATION:  08/25/2014  REFERRING PHYSICIAN:   CONSULTING PHYSICIAN:  Laurier NancyShaukat A. Erlene Devita, MD  INDICATION FOR CONSULT: Elevated troponin.   HISTORY OF PRESENT ILLNESS: This is a 49 year old, white male, with a history of Down syndrome, mental retardation, who came into the hospital with severe shortness of breath and respiratory failure. The patient was very tachycardic, short of breath, febrile and hypoxic, and was found on chest x-ray to have pneumonia. I was asked to evaluate the patient because the patient also incidentally had blood drawn and had elevated troponin. The patient denies any chest pain. Just has shortness of breath, which is gradually getting better with treatment for pneumonia. He also has a cough. He is putting out some sputum, green-colored.   PAST MEDICAL HISTORY: History of Down syndrome, mental retardation, history of pneumonia, gout.   HOME MEDICATIONS: Allopurinol, Symbicort, Combivent inhaler.   ALLERGIES: PENICILLIN AND SULFA.   SOCIAL HISTORY: No history of EtOH abuse or smoking.   FAMILY HISTORY: Positive for hypertension and mental retardation. The sister is taking care of the patient and the sister's son also has Down syndrome.   PHYSICAL EXAMINATION:  GENERAL: The patient is alert and oriented, very pleasant, in no distress.  VITAL SIGNS: Temperature is 97.7, pulse 83, respiration is 21, blood pressure is 114/74, saturation 92, but the patient is on oxygen.  NECK: Revealed positive JVD, about 7 to 8 cm.  LUNGS: There are diffuse rhonchi with some wheezing bilaterally, up to two-thirds of the lung fields.  HEART: Tachycardic. Normal S1, S2. No audible murmur.  ABDOMEN: Soft, nontender, positive bowel sounds.  EXTREMITIES: There is trace pedal edema.  NEUROLOGIC: The patient, except for having Down syndrome, seems quite alert. Shook my hand when I came. No neurological  deficit seen.  LABORATORIES AND DIAGNOSTIC DATA: His EKG showed sinus tachycardia, 123 beats per minute, poor R wave progression, biatrial enlargement, low voltage nonspecific ST-T changes. No acute EKG changes. No ST elevation.   His BUN is 17. Creatinine is 1.42. Glucose is 167.   His initial troponin was 0.20. Second one, 0.16.   White count is 17.9. Hemoglobin is 15.   Blood cultures apparently so far are negative.   ASSESSMENT AND PLAN: The patient has bilateral pneumonia with elevated white count, is being treated for that. Came in with respiratory failure. Incidentally had mildly elevated troponin. We will look at the wall motion on echocardiogram. Doubt this is due to non-ST-elevation myocardial infarction. It is probably demand ischemia.   At this time, advised continuation of treatment for pneumonia. No further cardiac work-up is needed other than an echocardiogram. We will make further recommendations after looking at the echocardiogram.   Thank you very much for the referral.    ____________________________ Laurier NancyShaukat A. Ryota Treece, MD sak:JT D: 08/25/2014 10:36:02 ET T: 08/25/2014 11:11:45 ET JOB#: [id number]  cc: Laurier NancyShaukat A. Elexus Barman, MD, <Dictator> Laurier NancySHAUKAT A Tristin Gladman MD ELECTRONICALLY SIGNED 08/27/2014 15:39

## 2015-01-27 IMAGING — CR DG CHEST 1V PORT
1 series · 1 of 1 positions shown · non-contrast
Comparison: CT 05/12/2013, chest radiograph 01/08/2011, and
05/12/2013

CLINICAL DATA: Cough and congestion over several days. [REDACTED]
resident

EXAM:
PORTABLE CHEST - 1 VIEW

[ap]
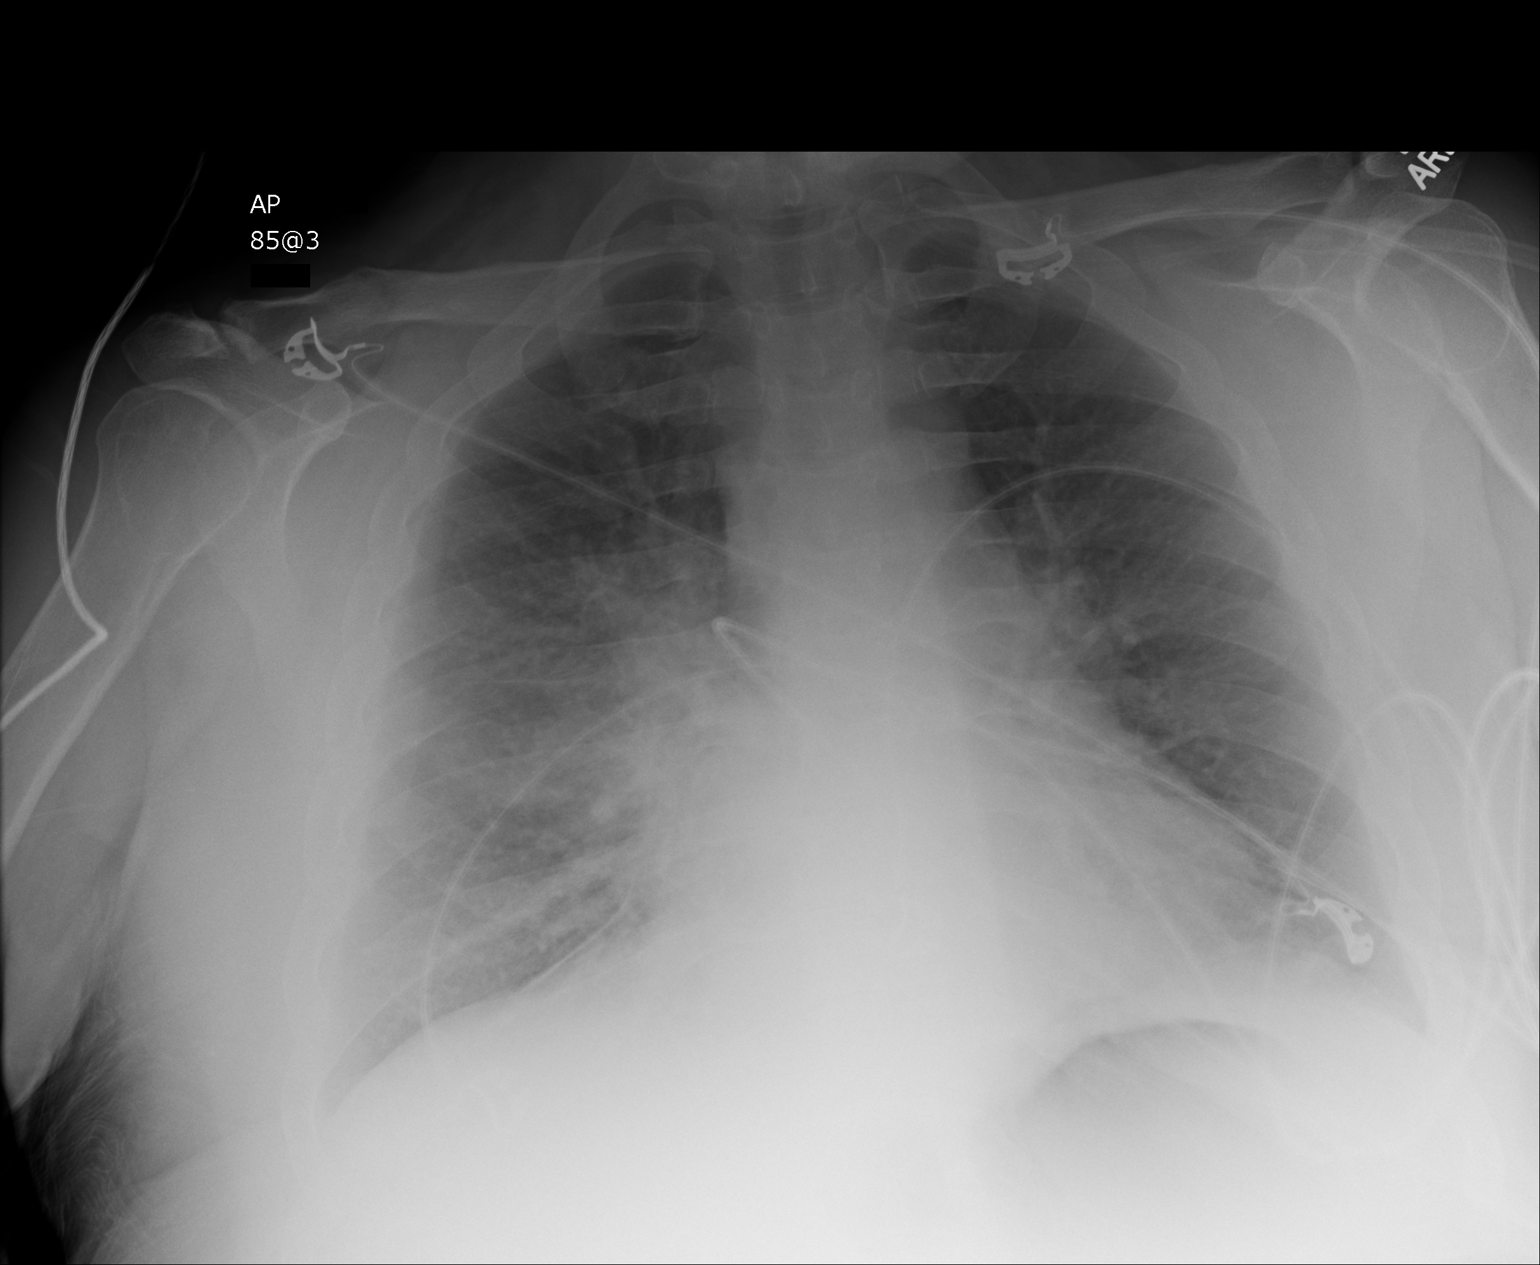

[1 of 1 positions shown; findings below may reference images not displayed]

FINDINGS: Normal cardiac silhouette. There is a right perihilar opacity which
obscures the right hilum and right heart border. No significant
atelectasis. No pleural fluid. No pneumothorax.
IMPRESSION: Right perihilar opacity is most concerning for pneumonia. Recommend
follow-up chest radiographs after appropriate therapy to exclude
mass lesion.

## 2015-01-28 IMAGING — CR DG ABDOMEN 1V
1 series · 1 of 1 positions shown · non-contrast
Comparison: None.

CLINICAL DATA: Orogastric tube placement

EXAM:
ABDOMEN - 1 VIEW

[ap]
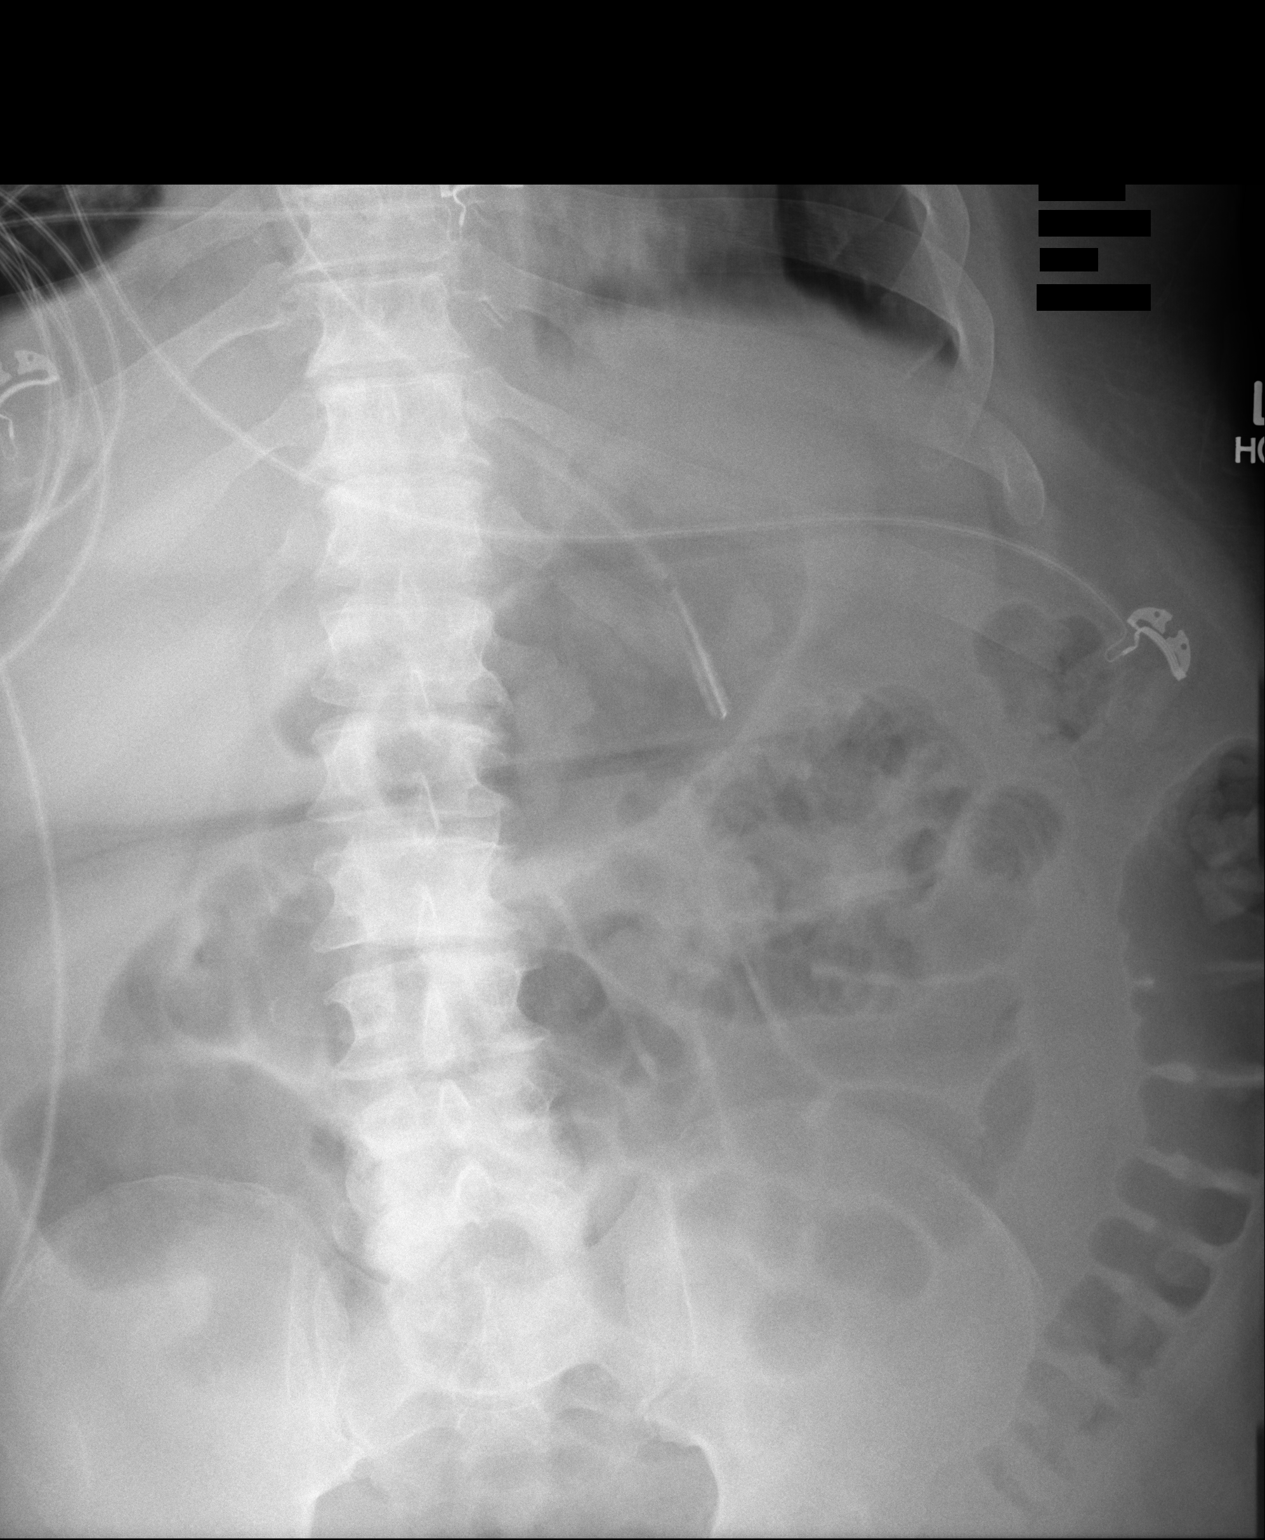

[1 of 1 positions shown; findings below may reference images not displayed]

FINDINGS: Orogastric tube is seen with tip projecting over the stomach.
IMPRESSION: Tube in appropriate position with tip over the stomach.

## 2015-01-28 IMAGING — CR DG CHEST 1V PORT
1 series · 1 of 1 positions shown · non-contrast
Comparison: Film performed at 8237 hr

CLINICAL DATA: Subsequent evaluation for central line and
orogastric tube placement

EXAM:
PORTABLE CHEST - 1 VIEW

[ap]
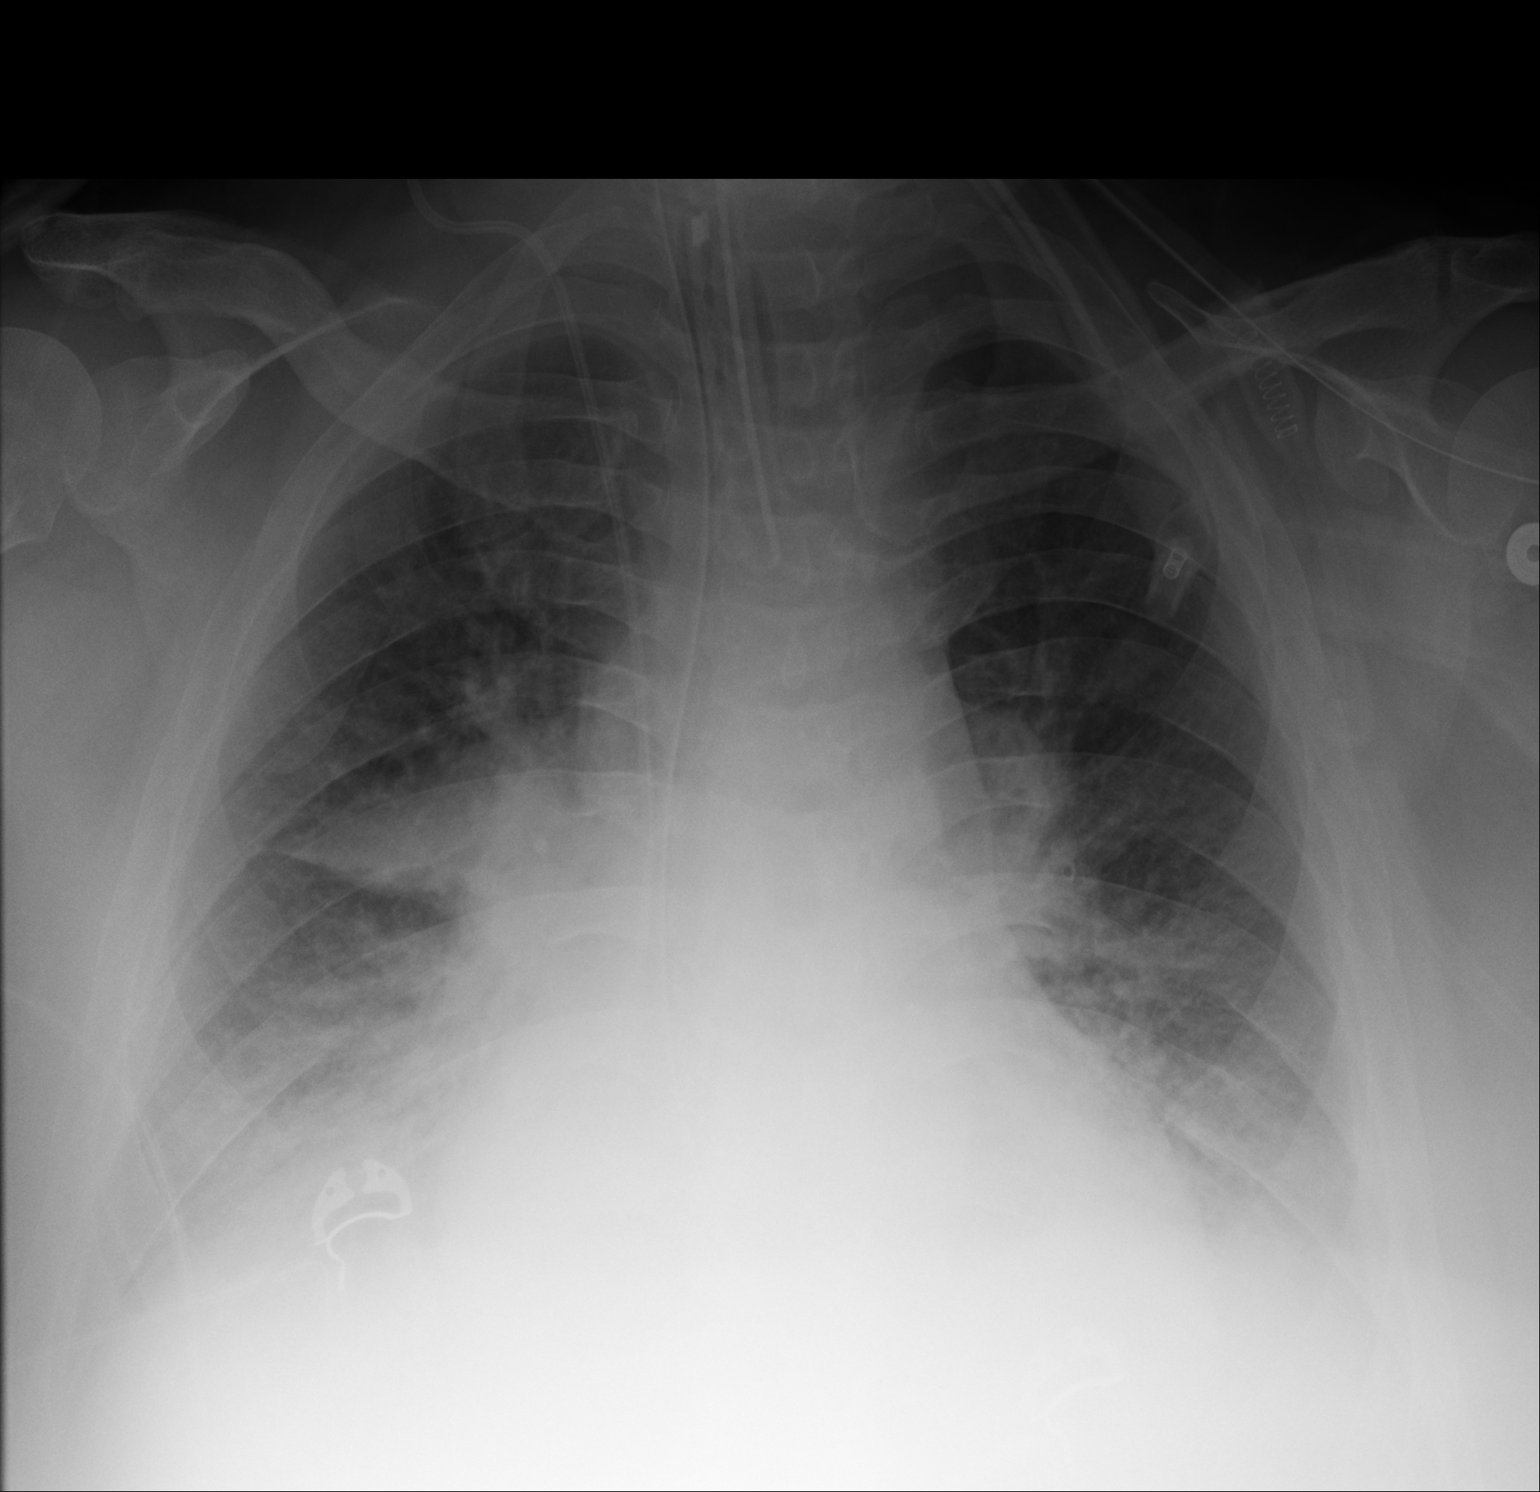

[1 of 1 positions shown; findings below may reference images not displayed]

FINDINGS: No change endotracheal tube. Right internal jugular central line
with tip at the cavoatrial junction. Orogastric tube not well
identified distally due to exposure technique. It is not confidently
seen well beyond the distal esophagus.

Cardiac enlargement. Hazy density both lung bases suggesting
infiltrate and effusion. Right middle lobe loculated fluid or
consolidation as well of left perihilar consolidation stable.
Interstitial prominence mildly increased.
IMPRESSION: Findings suggest pulmonary edema. Lines and tubes as described
above. Cannot confirm that the orogastric tube crosses the
gastroesophageal junction due to limited radiographic technique.

## 2017-04-07 NOTE — Telephone Encounter (Signed)
error
